# Patient Record
Sex: Female | Born: 1965 | Race: White | Hispanic: No | Marital: Married | Smoking: Never smoker
Health system: Southern US, Community
[De-identification: ages and names within clinical notes are randomized; demographics above are authoritative.]

## PROBLEM LIST (undated history)

## (undated) DIAGNOSIS — G4730 Sleep apnea, unspecified: Secondary | ICD-10-CM

## (undated) DIAGNOSIS — G4733 Obstructive sleep apnea (adult) (pediatric): Principal | ICD-10-CM

## (undated) DIAGNOSIS — Z22322 Carrier or suspected carrier of Methicillin resistant Staphylococcus aureus: Secondary | ICD-10-CM

## (undated) DIAGNOSIS — E875 Hyperkalemia: Secondary | ICD-10-CM

## (undated) DIAGNOSIS — T7840XA Allergy, unspecified, initial encounter: Secondary | ICD-10-CM

## (undated) DIAGNOSIS — R1013 Epigastric pain: Secondary | ICD-10-CM

## (undated) DIAGNOSIS — K649 Unspecified hemorrhoids: Secondary | ICD-10-CM

## (undated) DIAGNOSIS — F418 Other specified anxiety disorders: Secondary | ICD-10-CM

## (undated) DIAGNOSIS — E7800 Pure hypercholesterolemia, unspecified: Secondary | ICD-10-CM

## (undated) DIAGNOSIS — I10 Essential (primary) hypertension: Secondary | ICD-10-CM

## (undated) HISTORY — DX: Sleep apnea, unspecified: G47.30

## (undated) HISTORY — DX: Epigastric pain: R10.13

## (undated) HISTORY — DX: Other specified anxiety disorders: F41.8

## (undated) HISTORY — DX: Allergy, unspecified, initial encounter: T78.40XA

## (undated) HISTORY — DX: Hyperkalemia: E87.5

## (undated) HISTORY — DX: Obstructive sleep apnea (adult) (pediatric): G47.33

## (undated) HISTORY — DX: Unspecified hemorrhoids: K64.9

---

## 1997-08-17 ENCOUNTER — Inpatient Hospital Stay (HOSPITAL_COMMUNITY): Admission: AD | Admit: 1997-08-17 | Discharge: 1997-08-20 | Payer: Self-pay | Admitting: Obstetrics & Gynecology

## 1998-02-19 ENCOUNTER — Other Ambulatory Visit: Admission: RE | Admit: 1998-02-19 | Discharge: 1998-02-19 | Payer: Self-pay | Admitting: Family Medicine

## 1999-03-03 ENCOUNTER — Other Ambulatory Visit: Admission: RE | Admit: 1999-03-03 | Discharge: 1999-03-03 | Payer: Self-pay | Admitting: Obstetrics and Gynecology

## 1999-12-03 ENCOUNTER — Encounter: Admission: RE | Admit: 1999-12-03 | Discharge: 2000-03-02 | Payer: Self-pay | Admitting: Family Medicine

## 2000-06-12 ENCOUNTER — Other Ambulatory Visit: Admission: RE | Admit: 2000-06-12 | Discharge: 2000-06-12 | Payer: Self-pay | Admitting: Gynecology

## 2001-03-07 ENCOUNTER — Encounter (INDEPENDENT_AMBULATORY_CARE_PROVIDER_SITE_OTHER): Payer: Self-pay

## 2001-03-07 ENCOUNTER — Ambulatory Visit (HOSPITAL_COMMUNITY): Admission: RE | Admit: 2001-03-07 | Discharge: 2001-03-07 | Payer: Self-pay | Admitting: Obstetrics and Gynecology

## 2001-08-23 ENCOUNTER — Other Ambulatory Visit: Admission: RE | Admit: 2001-08-23 | Discharge: 2001-08-23 | Payer: Self-pay | Admitting: Obstetrics and Gynecology

## 2002-04-24 ENCOUNTER — Encounter: Payer: Self-pay | Admitting: Obstetrics and Gynecology

## 2002-04-24 ENCOUNTER — Ambulatory Visit (HOSPITAL_COMMUNITY): Admission: RE | Admit: 2002-04-24 | Discharge: 2002-04-24 | Payer: Self-pay | Admitting: Obstetrics and Gynecology

## 2002-11-14 ENCOUNTER — Inpatient Hospital Stay (HOSPITAL_COMMUNITY): Admission: AD | Admit: 2002-11-14 | Discharge: 2002-11-14 | Payer: Self-pay | Admitting: Pediatrics

## 2002-11-20 ENCOUNTER — Inpatient Hospital Stay (HOSPITAL_COMMUNITY): Admission: AD | Admit: 2002-11-20 | Discharge: 2002-11-22 | Payer: Self-pay | Admitting: Obstetrics and Gynecology

## 2002-12-20 ENCOUNTER — Other Ambulatory Visit: Admission: RE | Admit: 2002-12-20 | Discharge: 2002-12-20 | Payer: Self-pay | Admitting: Obstetrics and Gynecology

## 2003-01-05 ENCOUNTER — Emergency Department (HOSPITAL_COMMUNITY): Admission: EM | Admit: 2003-01-05 | Discharge: 2003-01-05 | Payer: Self-pay | Admitting: Emergency Medicine

## 2003-01-12 ENCOUNTER — Emergency Department (HOSPITAL_COMMUNITY): Admission: EM | Admit: 2003-01-12 | Discharge: 2003-01-12 | Payer: Self-pay | Admitting: Emergency Medicine

## 2003-05-31 HISTORY — PX: OTHER SURGICAL HISTORY: SHX169

## 2004-03-09 ENCOUNTER — Other Ambulatory Visit: Admission: RE | Admit: 2004-03-09 | Discharge: 2004-03-09 | Payer: Self-pay | Admitting: Obstetrics and Gynecology

## 2004-05-19 ENCOUNTER — Ambulatory Visit: Payer: Self-pay | Admitting: Internal Medicine

## 2004-05-19 ENCOUNTER — Emergency Department (HOSPITAL_COMMUNITY): Admission: EM | Admit: 2004-05-19 | Discharge: 2004-05-19 | Payer: Self-pay | Admitting: Emergency Medicine

## 2004-05-26 ENCOUNTER — Ambulatory Visit: Payer: Self-pay | Admitting: Internal Medicine

## 2004-06-18 ENCOUNTER — Ambulatory Visit: Payer: Self-pay | Admitting: Family Medicine

## 2004-06-21 ENCOUNTER — Ambulatory Visit: Payer: Self-pay | Admitting: Internal Medicine

## 2004-07-21 ENCOUNTER — Ambulatory Visit: Payer: Self-pay | Admitting: Internal Medicine

## 2004-08-16 ENCOUNTER — Ambulatory Visit: Payer: Self-pay | Admitting: *Deleted

## 2004-08-20 ENCOUNTER — Ambulatory Visit: Payer: Self-pay | Admitting: Internal Medicine

## 2004-08-20 ENCOUNTER — Observation Stay (HOSPITAL_COMMUNITY): Admission: RE | Admit: 2004-08-20 | Discharge: 2004-08-21 | Payer: Self-pay | Admitting: Internal Medicine

## 2004-09-27 ENCOUNTER — Ambulatory Visit: Payer: Self-pay | Admitting: Internal Medicine

## 2004-10-15 ENCOUNTER — Ambulatory Visit: Payer: Self-pay | Admitting: Family Medicine

## 2004-11-26 ENCOUNTER — Ambulatory Visit: Payer: Self-pay | Admitting: Family Medicine

## 2005-04-20 ENCOUNTER — Ambulatory Visit: Payer: Self-pay | Admitting: Family Medicine

## 2005-06-24 ENCOUNTER — Other Ambulatory Visit: Admission: RE | Admit: 2005-06-24 | Discharge: 2005-06-24 | Payer: Self-pay | Admitting: Obstetrics and Gynecology

## 2005-12-21 ENCOUNTER — Ambulatory Visit (HOSPITAL_COMMUNITY): Admission: RE | Admit: 2005-12-21 | Discharge: 2005-12-21 | Payer: Self-pay | Admitting: Cardiology

## 2005-12-21 ENCOUNTER — Encounter (INDEPENDENT_AMBULATORY_CARE_PROVIDER_SITE_OTHER): Payer: Self-pay | Admitting: Cardiology

## 2006-07-19 ENCOUNTER — Ambulatory Visit: Payer: Self-pay | Admitting: Infectious Diseases

## 2006-07-20 ENCOUNTER — Encounter: Payer: Self-pay | Admitting: Infectious Diseases

## 2006-07-20 DIAGNOSIS — Z22322 Carrier or suspected carrier of Methicillin resistant Staphylococcus aureus: Secondary | ICD-10-CM | POA: Insufficient documentation

## 2006-07-20 DIAGNOSIS — B009 Herpesviral infection, unspecified: Secondary | ICD-10-CM | POA: Insufficient documentation

## 2006-07-20 DIAGNOSIS — L0390 Cellulitis, unspecified: Secondary | ICD-10-CM

## 2006-07-20 DIAGNOSIS — I10 Essential (primary) hypertension: Secondary | ICD-10-CM | POA: Insufficient documentation

## 2006-07-20 DIAGNOSIS — L0291 Cutaneous abscess, unspecified: Secondary | ICD-10-CM | POA: Insufficient documentation

## 2006-08-29 ENCOUNTER — Telehealth: Payer: Self-pay | Admitting: Infectious Diseases

## 2006-08-30 ENCOUNTER — Ambulatory Visit: Payer: Self-pay | Admitting: Infectious Diseases

## 2006-08-30 DIAGNOSIS — M25519 Pain in unspecified shoulder: Secondary | ICD-10-CM | POA: Insufficient documentation

## 2006-10-17 ENCOUNTER — Ambulatory Visit: Payer: Self-pay | Admitting: Infectious Diseases

## 2006-10-18 ENCOUNTER — Encounter: Payer: Self-pay | Admitting: Infectious Diseases

## 2006-11-08 ENCOUNTER — Telehealth: Payer: Self-pay | Admitting: Infectious Diseases

## 2006-12-06 ENCOUNTER — Ambulatory Visit: Payer: Self-pay | Admitting: Infectious Diseases

## 2006-12-06 DIAGNOSIS — R21 Rash and other nonspecific skin eruption: Secondary | ICD-10-CM | POA: Insufficient documentation

## 2007-02-05 DIAGNOSIS — F329 Major depressive disorder, single episode, unspecified: Secondary | ICD-10-CM | POA: Insufficient documentation

## 2007-02-05 DIAGNOSIS — F3289 Other specified depressive episodes: Secondary | ICD-10-CM | POA: Insufficient documentation

## 2007-02-07 ENCOUNTER — Ambulatory Visit: Payer: Self-pay | Admitting: Infectious Diseases

## 2007-06-11 ENCOUNTER — Telehealth: Payer: Self-pay | Admitting: Infectious Diseases

## 2008-05-26 ENCOUNTER — Encounter: Payer: Self-pay | Admitting: Infectious Diseases

## 2008-06-30 ENCOUNTER — Encounter: Admission: RE | Admit: 2008-06-30 | Discharge: 2008-06-30 | Payer: Self-pay | Admitting: Internal Medicine

## 2009-12-23 IMAGING — CR DG SACRUM/COCCYX 2+V
4 series · 4 of 4 positions shown · non-contrast
Comparison: None

CLINICAL DATA: Coccygeal pain.

SACRUM AND COCCYX - 2+ VIEW

[t sacrum a.p.]
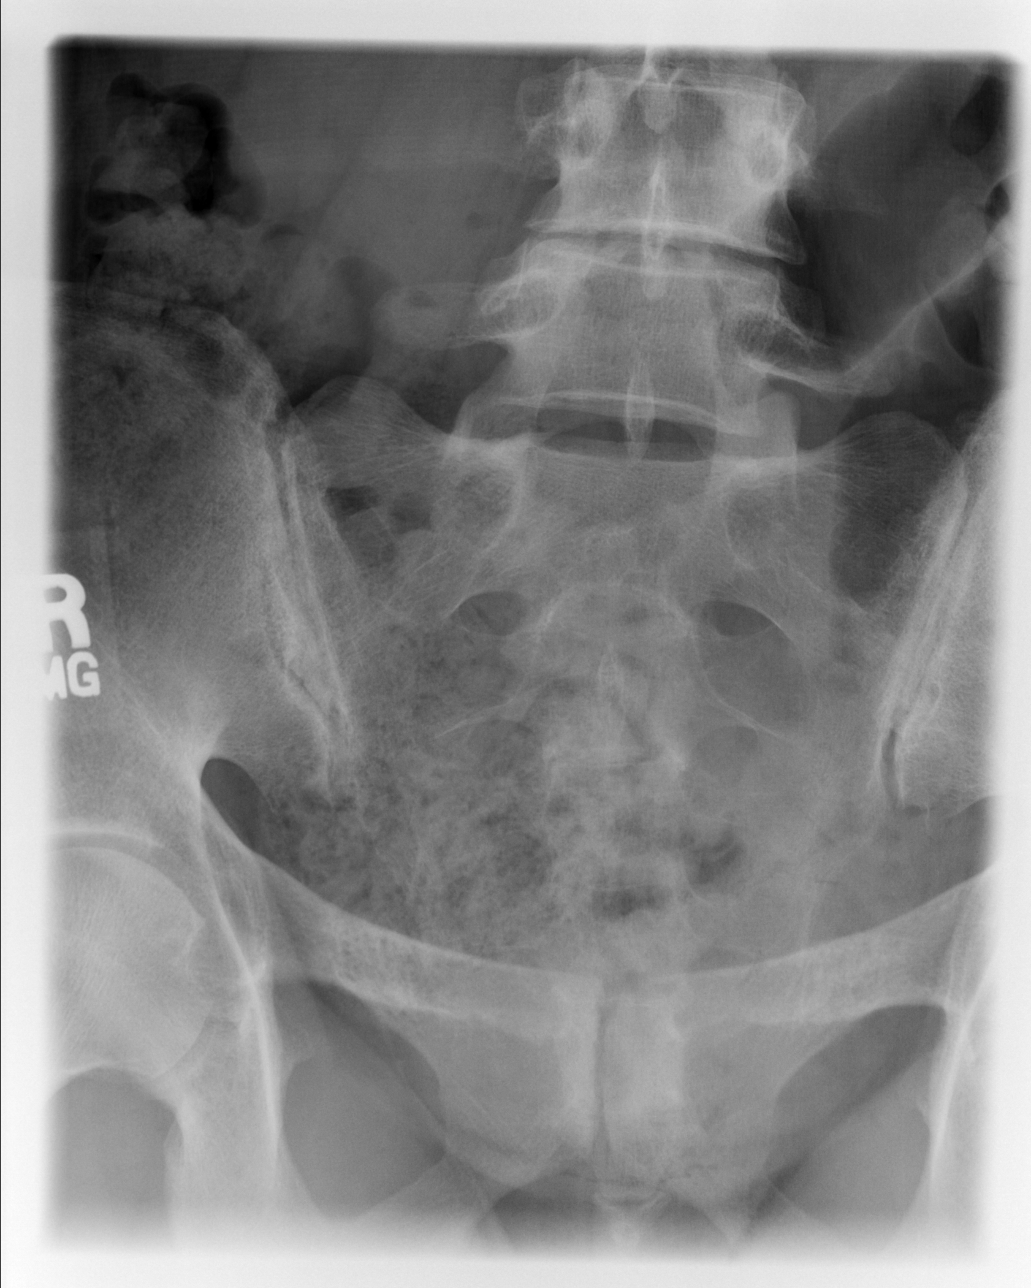

[t coccyx a.p.]
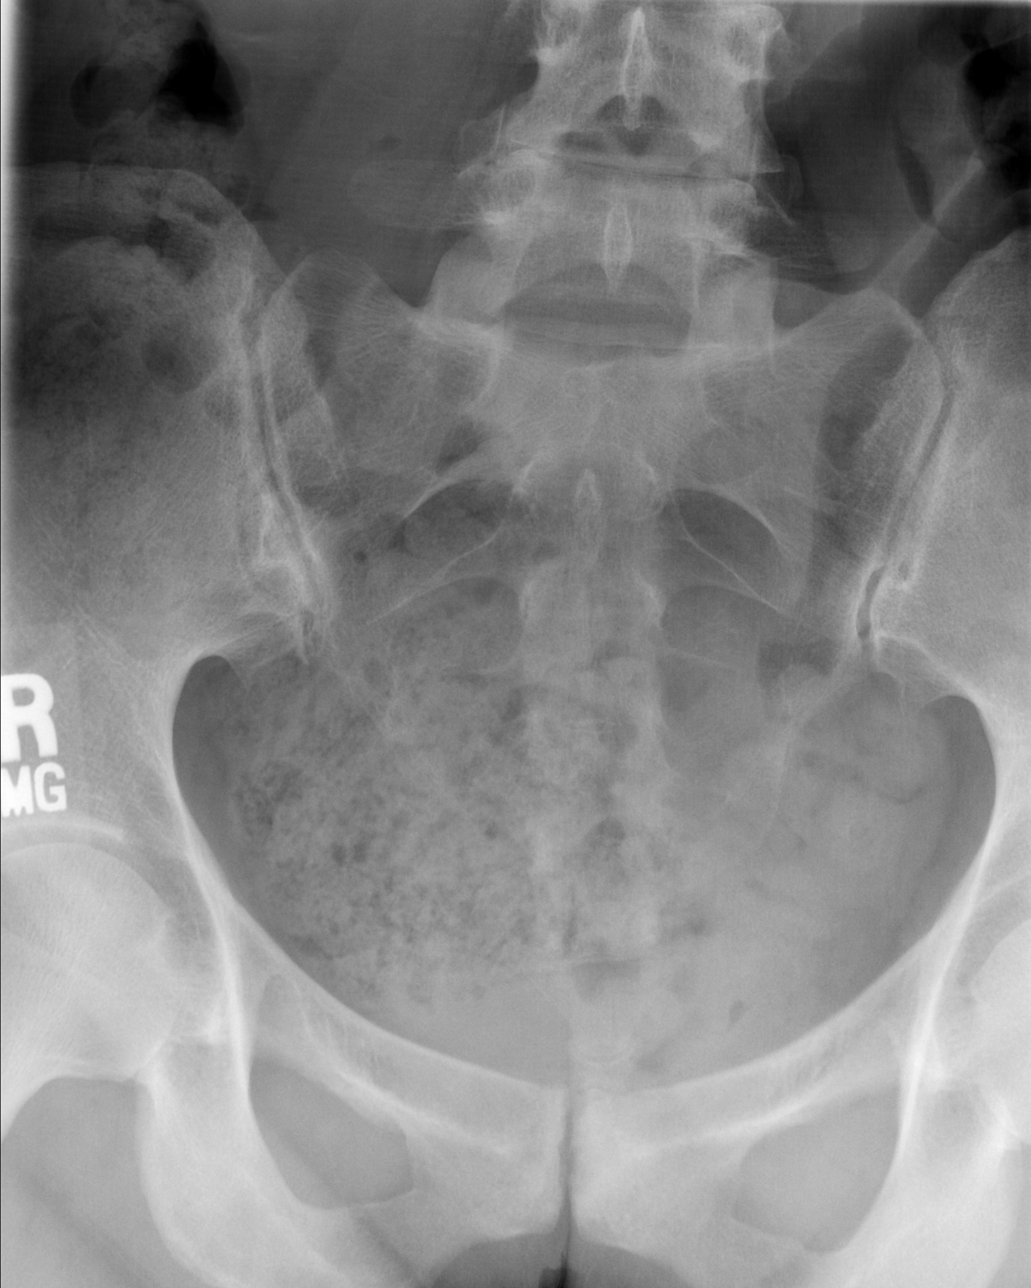

[t coccyx lat (1 of 2)]
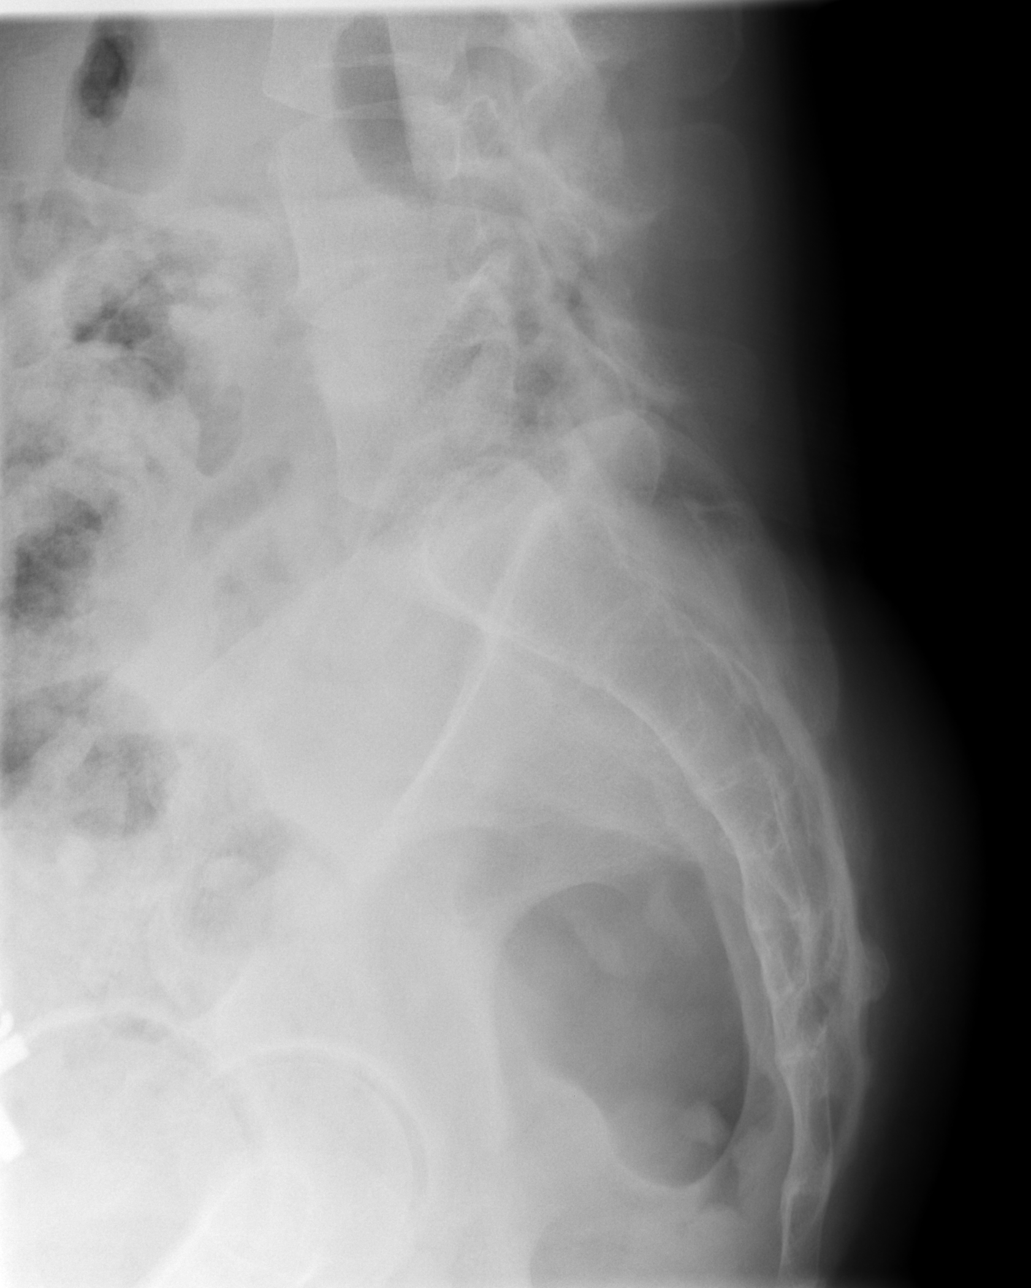

[t coccyx lat (2 of 2)]
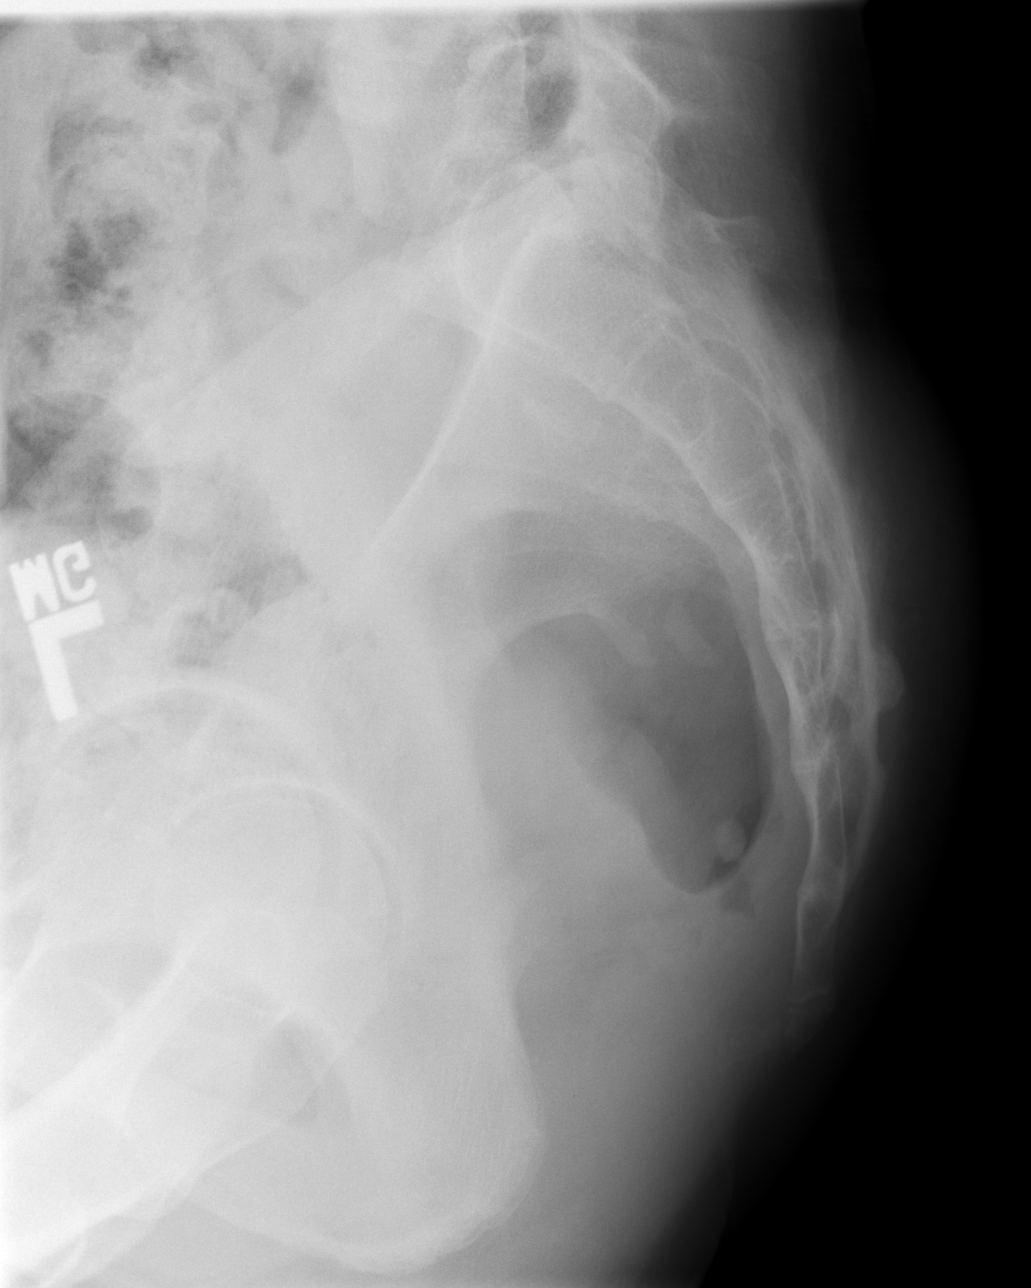

[4 of 4 positions shown; findings below may reference images not displayed]

FINDINGS: No sign of sacral fracture.  There is acute angulation at
the tip of the coccyx.  This can be normal or can reflect a
coccygeal post-traumatic malalignment.
IMPRESSION: No sacral fracture.  Angulation of the distal coccyx that can be
baseline or be post-traumatic.

## 2009-12-23 IMAGING — CR DG LUMBAR SPINE COMPLETE 4+V
5 series · 5 of 5 positions shown · non-contrast
Comparison: None

CLINICAL DATA: Sledding accident.  Coccygeal pain.

LUMBAR SPINE - COMPLETE 4+ VIEW

[t l-spine a.p.]
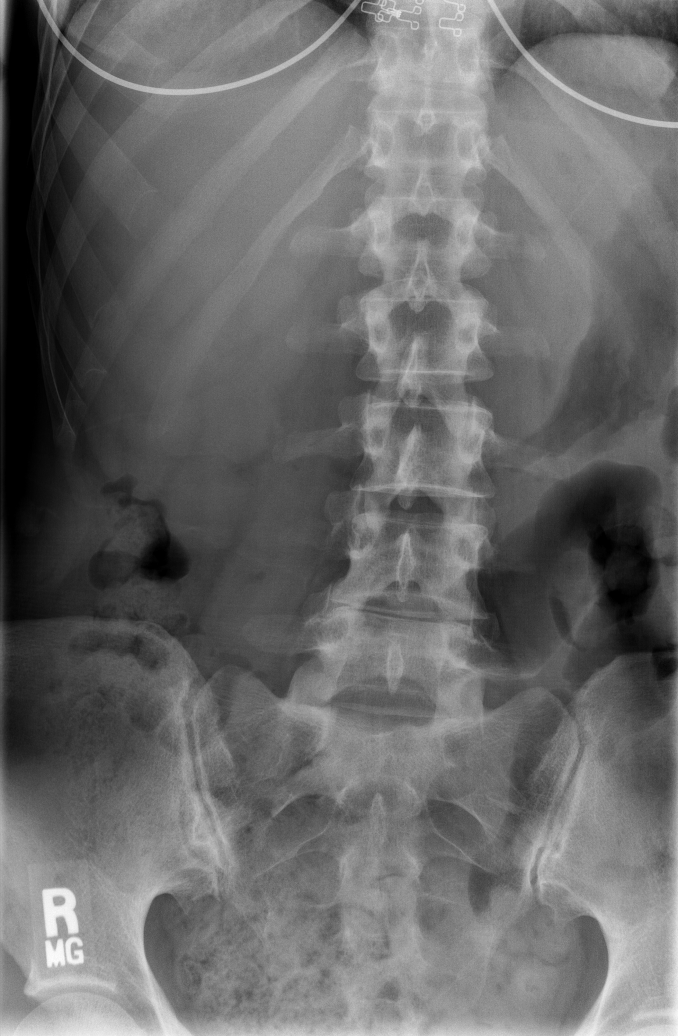

[t l-spine oblique exposure (1 of 2)]
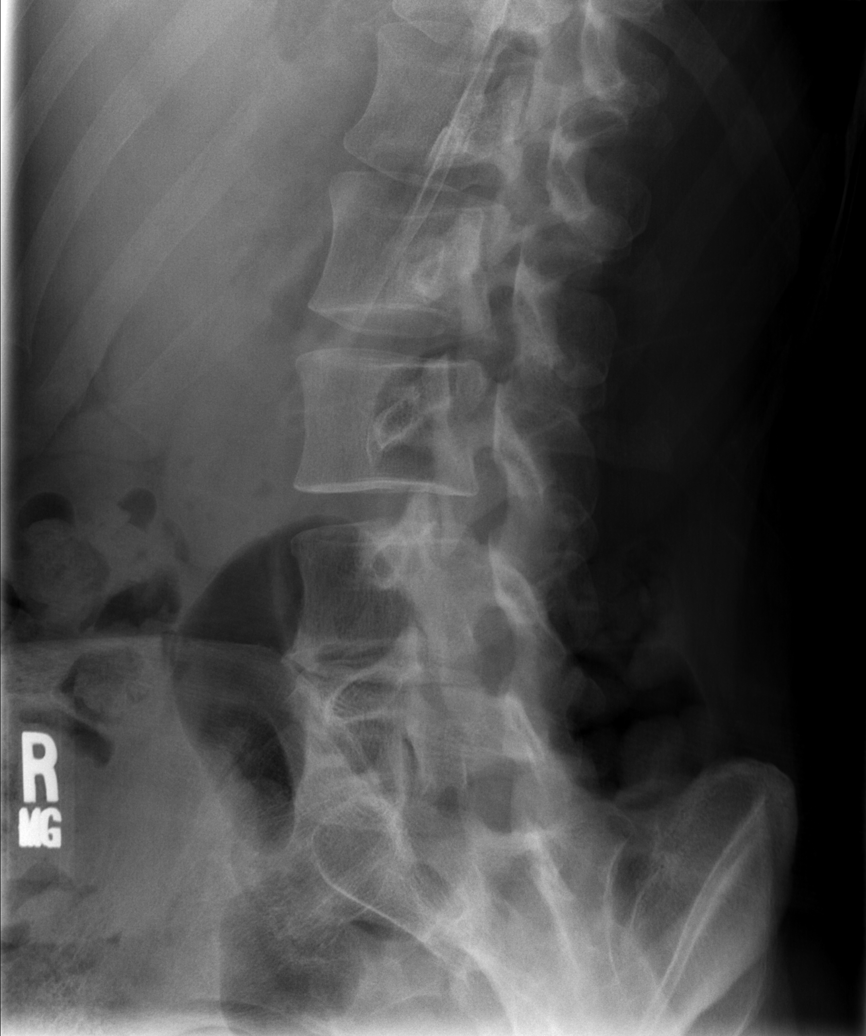

[t l-spine oblique exposure (2 of 2)]
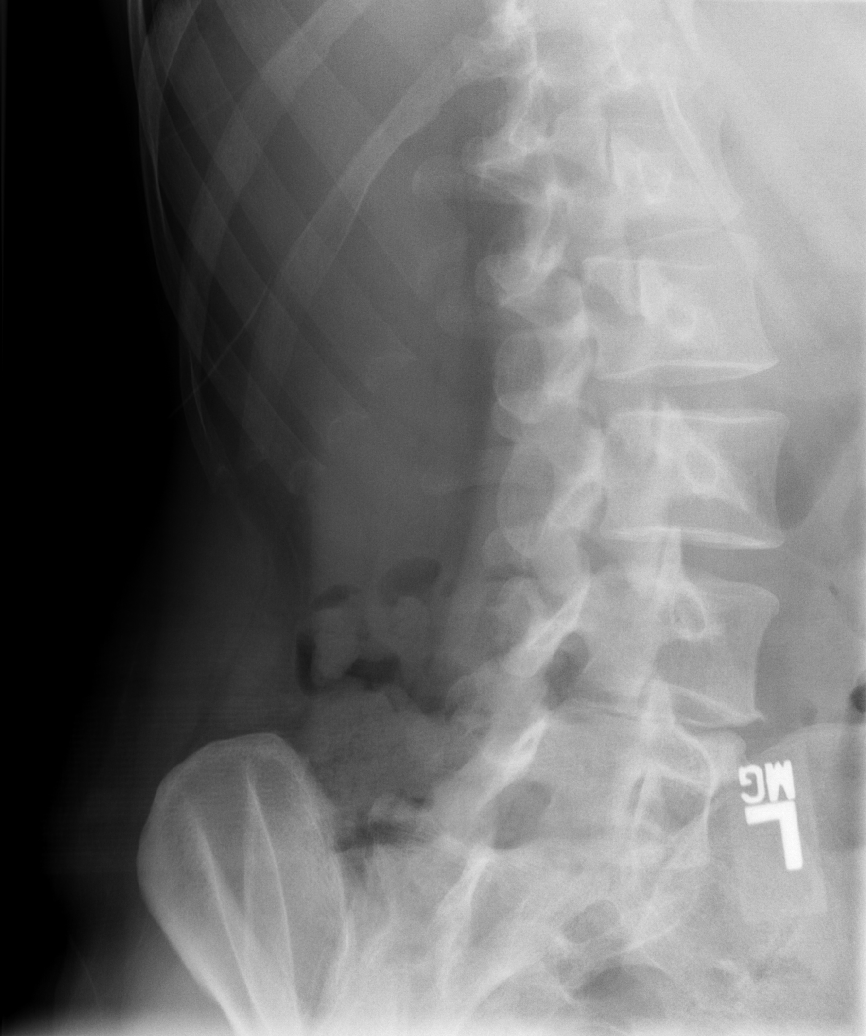

[t l-spine lat]
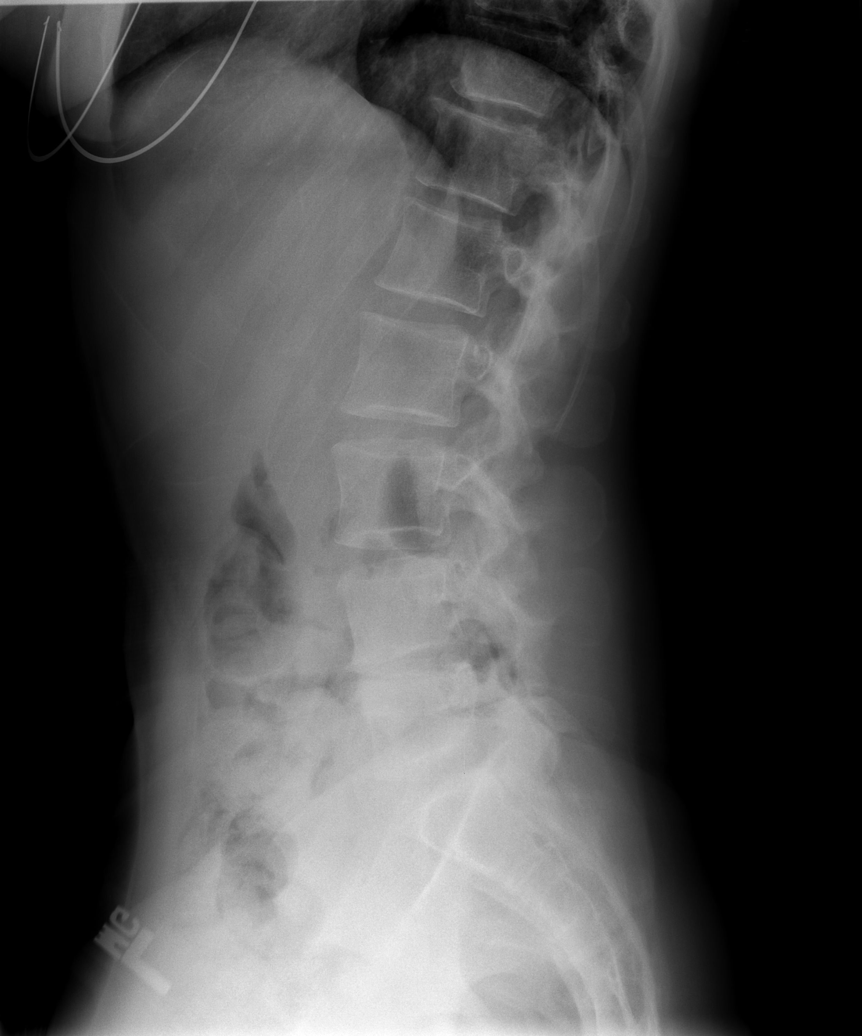

[t l-spine l5-s1 spot]
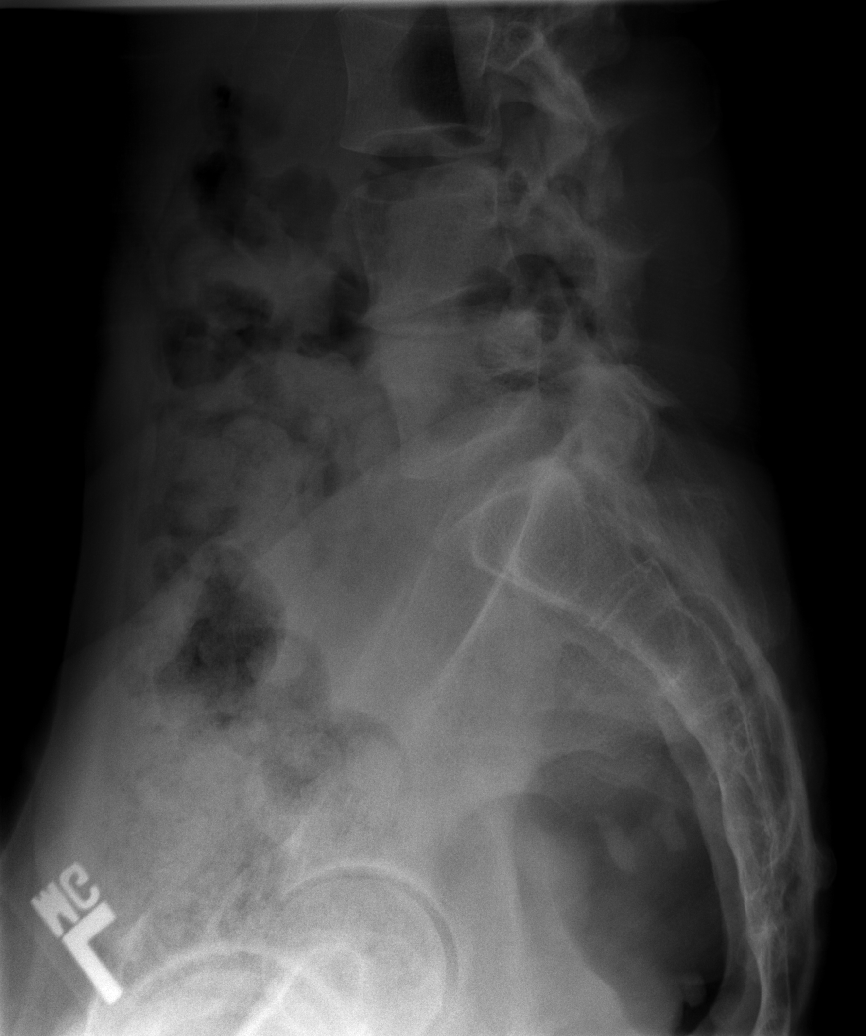

[5 of 5 positions shown; findings below may reference images not displayed]

FINDINGS: There is mild scoliosis convex to the left.  There is
disc space narrowing at L4-5.  No subluxation in the lateral
projection.  No sign of fracture.
IMPRESSION: This space narrowing at L4-5, presumably chronic.  No acute
finding.

## 2010-06-20 ENCOUNTER — Encounter: Payer: Self-pay | Admitting: Internal Medicine

## 2010-10-15 NOTE — Op Note (Signed)
Kayla Ortiz, Kayla Ortiz           ACCOUNT NO.:  [id number]   MEDICAL RECORD NO.:  [id number]          PATIENT TYPE:  INP   LOCATION:  6525                         FACILITY:  [facility]   PHYSICIAN:  Doylene Canning. [name], M.D.  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   ATTENDING:  Doylene Canning. [name], M.D.   I INTRODUCTION:  The patient is a 45 year old woman with a history of  recurrent tachy-palpitations and documented SVT at rates between 113  and  150 beats per minute.  These tend to start and stop suddenly.  They are not  related to exertion.  She has been on multiple medications.  She is now  referred for electrophysiology study and catheter ablation.   II. PROCEDURE:  After informed consent was obtained, the patient taken to  the diagnostic EP lab in fasting state.  After the usual preparation and  draping, intravenous fentanyl and midazolam were given for sedation.  A 6-  [place] hexapolar catheter was inserted percutaneously into the right jugular  vein and advanced to the coronary sinus.  A 5-French quadripolar catheter  was inserted percutaneously in the right femoral vein and advanced the His  bundle region.  A 5-French quadripolar catheter was inserted percutaneously  in the right femoral vein and advanced to the right ventricle.  After  measurement of the basic intervals, rapid ventricular pacing was carried out  the RV apex at pacing cycle length of 600 msec and stepwise decreased down  to 310 msec, where V-A Wenckebach was observed.  During rapid ventricular  pacing, the atrial activation sequence was midline and decremental.  Next,  programmed ventricular stimulation was carried out from the RV apex and at  basic drive cycle length of 478 msec. The S1-S2  interval was stepwise  decreased from 440 msec to 210 msec, where ventricular refractoriness was  observed.  During programmed ventricular stimulation, the atrial activation  sequence was midline and decremental.  Next, programmed atrial stimulation  was carried out from the coronary sinus at a basic drive cycle length of 295  msec.  The S1-S2 interval was stepwise decreased from 440 msec down to 330  msec, where A-V node ERP was observed.  During probing stimulation, there  were A-H jumps and echo beats noted, but no initial SVT.  Next, rapid atrial  pacing was carried out the coronary sinus and pacing cycle length of 490  msec and stepwise decreased down to 340 msec, where A-V Wenckebach was  observed.  During rapid atrial pacing, the P-R interval was equal to the R-R  interval, but there was initially no SVT induced.  Isoproterenol at 2 mcg  per minute was then infused.  Additional rapid atrial pacing was carried out  from the coronary sinus as well as from the high heart atrium at pacing  cycle lengths of 490 msec and stepwise-decreased down to 270 msec.  During  rapid atrial pacing at  270 msec, there was inducible SVT at a cycle length  of 440 msec.  Mapping of this tachycardia demonstrated midline atrial  activation and a very short V-A time with the V and A essentially  superimposed on the His bundle catheter.  In addition to that, the proximal  CS was activated earlier than the distal CS.  This tachycardia was  reproducibly induced and was terminated with rapid atrial pacing at a cycle  length of typically 330-350 msec.  In addition, during SVT, PVCs placed at  the time of His bundle refractoriness did not pre-excite the atrium.  Ventricular pacing in SVT during tachycardia demonstrated VA-AV conduction  sequence.  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  The ablation catheter was maneuvered into Koch's triangle.  Mapping was carried out in  Koch's triangle, demonstrating a very small  Koch's triangle.  Three RF energy applications were delivered at site 6 in  Koch's triangle, which resulted in prolonged accelerated junctional rhythm.  Following ablation, the accelerated junctional rhythm would immediately  terminate and sinus rhythm would be restored with normal A-V conduction.  Following RF energy application, isoproterenol was reinitiated at rates of 1-  2 mcg per minute and rapid atrial pacing was again carried out.  This time,  there was no inducible SVT.  The patient was observed for 30 minutes.  During this time, rapid atrial pacing was carried out from the coronary  sinus as well as from the high right atrium, as well as programmed atrial  stimulation from the coronary sinus and the high right atrium.  Despite  multiple attempts to reduce the tachycardia, it was not reinducible.  The  catheters were removed, hemostasis was assured and the patient was returned  to her room in satisfactory condition.   III. COMPLICATIONS:  There were no immediate procedural complications.   IV. RESULTS:  A.  Baseline ECG:  The baseline ECG demonstrates sinus rhythm  with normal axis and intervals.  B.  Baseline intervals:  The sinus node cycle length was 645 msec, the H-V  interval was 50 msec and the QRS duration was 84 msec.  C.  Rapid ventricular pacing:  Rapid ventricular pacing was carried out the  RV apex at pacing cycle length of 500 msec and stepwise decreased down to  310 msec, where a V-A Wenckebach was observed.  During rapid ventricular  pacing, the atrial activation was midline and decremental.  D.  Programmed ventricular stimulation:  Programmed atrial stimulation was  carried out the RV apex at a basic drive cycle length of 132 msec. The S1-S2  interval was stepwise decreased down to 210 msec, where ventricular  refractoriness was observed.  During programmed ventricular stimulation, the atrial activation sequence was  midline and decremental.  E.  Programmed atrial stimulation:  Programmed atrial stimulation was  carried out from the coronary sinus as well as from the high right atrium at  the basic drive cycle lengths of 440 msec.  The S1-S2 interval was stepwise  decreased down to the atrial refractoriness.  During probing stimulation,  there were multiple A-H jumps and echo beats, but no inducible SVT.  F. Rapid atrial pacing:  Rapid atrial pacing  was carried out from the  coronary sinus as well as the heart atrium at pacing cycle length of 600  msec and stepwise decreased down to 340 msec, where A-V Wenckebach was  observed.  During rapid atrial pacing the P-R interval was greater  than the  R-R interval.  On isoproterenol, rapid atrial pacing resulted in inducible  SVT.  G.  Arrhythmias observed:  1.  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  On isoproterenol, duration was sustained.  Termination was with      rapid atrial pacing.      1.  Mapping:  Mapping of Koch's triangle demonstrated a very, very small          Koch's triangle.          1.  RF energy application:  A total of 3 RF energy applications were              delivered to site 6 in Koch's triangle, resulting in accelerated              junctional rhythm.  Following ablation, there was no inducible              SVT.   V. CONCLUSION:  The study demonstrates successful electrophysiologic study  and radiofrequency catheter ablation of typical atrioventricular node  reentry tachycardia with a total of 3 radiofrequency energy applications at  the Koch's triangle, resulting in rendering of the tachycardia not  inducible.      GWT/MEDQ  D:  08/20/2004  T:  08/21/2004  Job:  [id number]   cc:   Gordy Savers, M.D. [facility]

## 2010-10-15 NOTE — Discharge Summary (Signed)
Kayla Ortiz, Kayla Ortiz           ACCOUNT NO.:  [id number]   MEDICAL RECORD NO.:  [id number]          PATIENT TYPE:  INP   LOCATION:  6525                         FACILITY:  [facility]   PHYSICIAN:  Doylene Canning. [name], M.D.  DATE OF BIRTH:  June 04, 1965   DATE OF ADMISSION:  08/20/2004  DATE OF DISCHARGE:  08/21/2004                           DISCHARGE SUMMARY - REFERRING   HISTORY OF PRESENT ILLNESS:  [name] is a 45 year old, white female who  is followed by Dr. [name] for recurrent tachycardic palpitations.  These  initially began back in November or December 2005.  She was noted to have a  narrow QRS tachycardia at rates between 130-170 with a short RP tachycardia  and Dr. [name] noted that P-wave morphology was somewhat difficult to  ascertain.  Despite medications with reoccurring problems with tachycardic  palpitations, the patient elected to undergo ablation.   LABORATORY DATA AND X-RAY FINDINGS:  Preadmission H&H is 15.1 and 45.5,  normal indices, platelets 234, WBC 6.6.  PTT 30.1, PT 12.8, INR 1.  Sodium  139, potassium 4.0, BUN 12, creatinine 0.9.  HCG was negative.   Last chest x-ray on December 21, did not show any active evidence of acute  disease.  EKG postprocedure showed normal sinus rhythm, normal axis, no  change.   HOSPITAL COURSE:  Dr. [name] performed ablation on August 20, 2004, without  difficulty.  Please refer to dictated note.  On the morning of March 25, she  stated she was doing well.  She was complaining of a headache.  Catheterization sites were intact.  Dr. Dorethea Clan reviewed and felt that the  patient could be discharged home.   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   DISPOSITION:  She is discharged home.   DISCHARGE MEDICATIONS:  1.  Effexor 150 mg b.i.d.  2.  Wellbutrin 150 t.i.d.  3.  Propranolol 10 p.r.n.  4.  Enteric coated aspirin 325 q.d. for the  next 6 months.  5.  Four Tylenol No. 3 one q.6h. for her headaches without refills.   SPECIAL INSTRUCTIONS:  She was also instructed over the next 6 months until  September 2006, if she had any dental work including teeth cleaning, to call  [facility] for antibiotic coverage.   ACTIVITY:  She was instructed to avoid driving for the next 2 days.  Straining or heavy lifting for 2 weeks.   WOUND CARE:  She may shower.  If she had any problems with the  catheterization site, she was instructed to call.   FOLLOW UP:  Dr. Lubertha Basque office will call her with an appointment in  approximately 4 weeks.      EW/MEDQ  D:  08/21/2004  T:  08/21/2004  Job:  [id number]   cc:   Vida Roller, M.D.  Fax: [phone]   Tera Mater. Clent Ridges, M.D. [facility]

## 2010-10-15 NOTE — Discharge Summary (Signed)
Kayla Ortiz, Kayla Ortiz           ACCOUNT NO.:  [id number]   MEDICAL RECORD NO.:  [id number]          PATIENT TYPE:  INP   LOCATION:  6525                         FACILITY:  [facility]   PHYSICIAN:  Doylene Canning. [name], M.D.  DATE OF BIRTH:  Nov 01, 1965   DATE OF ADMISSION:  08/20/2004  DATE OF DISCHARGE:  08/21/2004                                 DISCHARGE SUMMARY   DISCHARGE DIAGNOSES:  1.  History of symptomatic and recurrent narrow QRS tachyarrhythmias found      on electrophysiology study to be A-V nodal reentrant tachycardia.  2.  Status post successful radiofrequency catheter ablation of AVNRT by Dr.      Lewayne Bunting.   SECONDARY DIAGNOSES:  1.  History for the last five months of tachyarrhythmias with symptomatic      palpitations and shortness of breath.  Symptoms are shortness of breath      but no chest pain or syncope.  2.  Adult attention deficit disorder on Adderall, which was stopped in the      face of these tachy palpitations.   PROCEDURE:  August 20, 2004:  Electrophysiology study with radiofrequency  catheter ablation of AVNRT, Dr. Lewayne Bunting.  Patient is maintaining sinus  rhythm post procedure.   Discharging post procedure day #1.   Patient discharged on the following medications:  1.  Effexor 150 mg twice daily.  2.  Wellbutrin 150 mg three tabs daily.  3.  Propranolol 10 mg to be taken as needed for palpitation.  4.  Adderall, which the patient has been taking prior to her tachy      palpitations, will be added back at her discretion.   She is asked to start enteric coated aspirin 325 mg daily for the next 6  weeks and for the next 6 months until September, 2006.  She is to call  [facility] at [phone] if she plans dental work, even teeth  cleaning; [facility] will provide antibiotic coverage.  For pain at  the catheterization site, Tylenol 325 mg 1-2 tabs every 4-6 hours as needed.  She is to avoid driving in the next 2 days, avoid straining  or heavy lifting  for the next 2 weeks, she may shower, and she is to call [facility]  if she experiences pain or swelling at the catheterization sites.  She is to  see Dr. [name] in the office in 4 weeks, is office will call with that  appointment.   BRIEF HISTORY:  The patient is a 45 year old female.  She has a history of  recurrent tachy palpitations which initially started November or December,  2005.  She came to the emergency room and at that time was found to have a  narrow QRS tachycardia with high rates up to 170s.  Apparently the strip  showed a short RP tachycardia, although P-wave morphology is somewhat  difficult to distinguish.  The patient had initially been taken off her  Adderall but felt fatigued and sluggish when not on it.  When this was  restarted the patient did develop recurrent severe tachy palpitations.  She  was seen initially on May 26, 2004, and at that time electrophysiology  study was mentioned although the first option would be to continue beta-  blocker therapy.  Because the patient wishes to restart taking Adderall, the  patient wishes to proceed with catheter ablation.  This will be planned  electively for August 20, 2004.   HOSPITAL COURSE:  The patient presented August 20, 2004, for  electrophysiology study and, if possible, radiofrequency catheter ablation.  The study showed A-V nodal reentrant tachycardia, this was successfully  ablated by Dr. Lewayne Bunting.  The patient has had no complications post  ablation and goes home with the medications and followup as dictated.      GM/MEDQ  D:  08/20/2004  T:  08/22/2004  Job:  [id number]   cc:   Gordy Savers, M.D. [facility]   Doylene Canning. [name], M.D.

## 2010-10-15 NOTE — Op Note (Signed)
[facility]  Patient:    Kayla Ortiz, Kayla Ortiz Visit Number: [id number] MRN: [id number]          Service Type: Attending:  Janeece Riggers. Dareen Piano, M.D. Dictated by:   Janeece Riggers Dareen Piano, M.D. Proc. Date: 03/07/01                             Operative Report  PREOPERATIVE DIAGNOSIS:       Spontaneous abortion.  POSTOPERATIVE DIAGNOSIS:      Spontaneous abortion.  PROCEDURE:                    Dilation and curettage.  SURGEON:                      Mark E. Dareen Piano, M.D.  ANESTHESIA:                   MAC with paracervical block.  ANTIBIOTICS:                  Ancef 1 g.  DRAINS:                       None.  ESTIMATED BLOOD LOSS:         Minimal.  COMPLICATIONS:                None.  SPECIMEN:                     Products of conception sent to pathology.  INDICATIONS:                  Ms. Klutts is a 45 year old white female who is approximately 7-1/2 weeks estimated gestational age who began to have spotting. The patient had an ultrasound which revealed gestational sac with no fetal heart tones. Sac size was consistent with 6-1/2 weeks. The patient had a quantitative beta hCG which went from 37,000 to 39,000. Given these findings, the patient was diagnosed with a spontaneous AB and set up for a dilation and curettage.  DESCRIPTION OF PROCEDURE:     The patient was taken to the operating room where she was placed in a dorsal supine position and MAC anesthesia was administered. She was placed in a dorsal lithotomy position, prepped with Hibiclens, and she was draped with green towels. A sterile speculum was placed in the vagina. Lidocaine 1%, 20 cc, was used for paracervical block. The anterior cervix was then grasped with a single tooth tenaculum and the cervical os was dilated to a 29 [place]. A 7 mm suction cannula was placed in the uterine cavity and products of conception withdrawn. Sharp curettage was then performed followed by a repeat  suction. The patient tolerated the procedure well.  DISPOSITION:                  The patient was discharged to home. She was sent home with Keflex 500 mg q.i.d. for two days and Tylox to take p.r.n. She will follow up in the office in four weeks. Dictated by:   Janeece Riggers Dareen Piano, M.D. Attending:  Janeece Riggers Dareen Piano, M.D. DD:  03/07/01 TD:  03/07/01 Job: [id number]

## 2010-10-15 NOTE — H&P (Signed)
NAMEGENESIA, CASLIN NO.:  [id number]   MEDICAL RECORD NO.:  [id number]          PATIENT TYPE:  EMS   LOCATION:  MAJO                         FACILITY:  [facility]   PHYSICIAN:  Rollene Rotunda, M.D.   DATE OF BIRTH:  [birth year]   DATE OF ADMISSION:  05/19/2004  DATE OF DISCHARGE:                                HISTORY & PHYSICAL   HISTORY OF PRESENT ILLNESS:  Ms. 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   The patient reports history of occasional palpitations, occurring  approximately once every six months, but developed unusually hard thumping  and palpitations approximately 12 noon today while she was still at work.  She had initial electrocardiogram there revealing supraventricular  tachycardia at 146 beats per minute with associated blood pressure of  170/120.  She felt that this was related to Adderall but went directly to  her primary care physician's office for evaluation of her blood pressure.  Repeat electrocardiogram showed persistent SVT at 147, and she was directed  to the emergency room.   On presentation, the patient denied any symptoms and, in fact, denied any  associated chest pain, diaphoresis, nausea, or dyspnea when the episode  began earlier that day.  Repeat electrocardiogram revealed persistent SVT in  the 140 beats per minute range.   ALLERGIES:  No known drug allergies.   MEDICATIONS:  1.  Adderall 50 mg daily.  2.  Effexor 150 mg b.i.d.  3.  Wellbutrin 450 mg daily.   PAST MEDICAL HISTORY:  Adult ADD.   SOCIAL HISTORY:  The patient lives in [place] with her husband.  She is  employed by [employer], working the Psychologist, sport and exercise.  She has two  children.  She denies any history of tobacco smoking, alcohol use, or  illicit drug use.   FAMILY  HISTORY:  Mother, age 48, hypertension.  Father, age 50, history of  stroke and DVTs.  A twin sister, age 22, hypertension.   REVIEW OF SYSTEMS:  Denies any history of exertional chest discomfort, has  had exertional dyspnea over the past six months, but denies orthopnea, PND,  or pedal edema.  Occasional palpitations (as described above).  Denies  history of thyroid disease.  No recent fever, chills, or productive cough.  Denies recent evidence of upper for lower GI bleeding.  Remaining systems  negative.   PHYSICAL EXAMINATION:  VITAL SIGNS:  Blood pressure 169/105 with a pulse of  146 on admission.  Temperature 98.7.  GENERAL:  An 45 year old female in no apparent distress.  HEENT:  Normocephalic and atraumatic.  NECK:  Preserved carotid pulses without bruits.  HEART:  Regular rhythm with increased rate (S1 and S2). No significant  murmurs, no rub.  LUNGS: Clear to auscultation in all fields.  ABDOMEN:  Soft, nontender.  Bowel sounds.  EXTREMITIES:  Preserved bilateral femoral pulses and intact distal pulses;  no edema noted.  NEUROLOGIC:  No focal deficits.   LABORATORY DATA  AND OTHER STUDIES:  Chest x-ray:  Pending.   Electrocardiogram:  Supraventricular tachycardia  at 143 beats per minute  with left axis deviation, nonspecific ST-T changes.   Laboratory data:  WBC 5.9, hemoglobin 15, hematocrit 44, platelets 245.  INR  0.9.  Sodium 136, potassium 3.6, glucose 91, BUN 7, creatinine 1.0.  Liver  enzymes normal.  TSH pending.   IMPRESSION:  1.  Supraventricular tachycardia..  2.  Adult attention deficit disorder.  3.  Hypertension.   PLAN:  The patient was successfully treated with successive doses of  adenosine at 6 mg, 12 mg, with subsequent conversion to normal sinus rhythm.  She remained asymptomatic during the episode.  She is cleared to be  discharged from the emergency room and will be started on Toprol XL 25 mg  daily.  We will have her follow up with use in the  office in the next few  weeks for continued monitoring.  Dr. Antoine Poche did suggest considering  discontinuation of Adderall and will review with this with Dr. Graciela Husbands, in  context of her SVT.  We also discussed the option of radio frequency  ablation in the event the tachyarrhythmia becomes recurrent.       GS/MEDQ  D:  05/19/2004  T:  05/19/2004  Job:  [id number]   cc:   Jeannett Senior A. Clent Ridges, M.D. [facility]

## 2011-05-14 ENCOUNTER — Emergency Department (HOSPITAL_COMMUNITY): Payer: BC Managed Care – PPO

## 2011-05-14 ENCOUNTER — Emergency Department (HOSPITAL_COMMUNITY)
Admission: EM | Admit: 2011-05-14 | Discharge: 2011-05-14 | Disposition: A | Discharge status: Home / Self Care | Payer: BC Managed Care – PPO | Attending: Emergency Medicine | Admitting: Emergency Medicine

## 2011-05-14 DIAGNOSIS — I10 Essential (primary) hypertension: Secondary | ICD-10-CM | POA: Insufficient documentation

## 2011-05-14 DIAGNOSIS — M533 Sacrococcygeal disorders, not elsewhere classified: Secondary | ICD-10-CM | POA: Insufficient documentation

## 2011-05-14 DIAGNOSIS — E7800 Pure hypercholesterolemia, unspecified: Secondary | ICD-10-CM | POA: Insufficient documentation

## 2011-05-14 DIAGNOSIS — Z79899 Other long term (current) drug therapy: Secondary | ICD-10-CM | POA: Insufficient documentation

## 2011-05-14 DIAGNOSIS — W19XXXA Unspecified fall, initial encounter: Secondary | ICD-10-CM

## 2011-05-14 DIAGNOSIS — W108XXA Fall (on) (from) other stairs and steps, initial encounter: Secondary | ICD-10-CM | POA: Insufficient documentation

## 2011-05-14 DIAGNOSIS — R42 Dizziness and giddiness: Secondary | ICD-10-CM

## 2011-05-14 HISTORY — DX: Essential (primary) hypertension: I10

## 2011-05-14 HISTORY — DX: Pure hypercholesterolemia, unspecified: E78.00

## 2011-05-14 HISTORY — DX: Carrier or suspected carrier of methicillin resistant Staphylococcus aureus: Z22.322

## 2011-05-14 LAB — POCT I-STAT, CHEM 8
Calcium, Ion: 1.12 mmol/L (ref 1.12–1.32)
Chloride: 102 mEq/L (ref 96–112)
Creatinine, Ser: 1 mg/dL (ref 0.50–1.10)
Glucose, Bld: 103 mg/dL — ABNORMAL HIGH (ref 70–99)
HCT: 42 % (ref 36.0–46.0)
Potassium: 3.7 mEq/L (ref 3.5–5.1)

## 2011-05-14 NOTE — ED Notes (Signed)
Patients that she slipped last night on stairs and fell.  Pt denies hitting her head.  Pt states that she had severe pain to her tail bone after fall.  Pt states that she had today she has had increase in dizziness with rapid change of position.  Pt has no n/v.  Pt denies any neck or back pain.  PERRL.  NO neuro deficits.  Pt states that last night after all she was very cold and cold not get warm.  Pt denies any dizziness at this time.  A&Ox4.  Pt does have bruising noted to her right FA with swelling to same.  Pt has full ROM to her FA.  Pt is ambulatory without difficulty.

## 2011-05-14 NOTE — ED Notes (Signed)
Patient transported to X-ray 

## 2011-05-14 NOTE — ED Notes (Signed)
Pt. Fell last night down 6 steps, witnessed by husband. Had LOC for seconds. No visble marks on her head. Pt.s husband saw pt. Shake for a few seconds after the fall.  AT present time, pt. Feels very dizzy and nauseated

## 2011-05-15 NOTE — ED Provider Notes (Addendum)
History     CSN: [id number] Arrival date & time: 05/14/2011 11:31 AM   First MD Initiated Contact with Patient 05/14/11 1157      Chief Complaint  Patient presents with  . Fall     HPI Patients that she slipped last night on stairs and fell. Pt denies hitting her head. Pt states that she had severe pain to her tail bone after fall. Pt states that she had today she has had increase in dizziness with rapid change of position. Pt has no n/v. Pt denies any neck or back pain. PERRL. NO neuro deficits. Pt states that last night after all she was very cold and cold not get warm. Pt denies any dizziness at this time. A&Ox4. Pt does have bruising noted to her right FA with swelling to same. Pt has full ROM to her FA. Pt is ambulatory without difficulty   Past Medical History  Diagnosis Date  . Hypertension   . Hypercholesteremia   . MRSA (methicillin resistant staph aureus) culture positive     Past Surgical History  Procedure Date  . Cardaic ablasion     Family History  Problem Relation Age of Onset  . Hypertension Mother   . Heart failure Mother   . Stroke Father     History  Substance Use Topics  . Smoking status: Never Smoker   . Smokeless tobacco: Not on file  . Alcohol Use: Yes    OB History    Grav Para Term Preterm Abortions TAB SAB Ect Mult Living                  Review of Systems  All other systems reviewed and are negative.    Allergies  Sulfonamide derivatives  Home Medications   Current Outpatient Rx  Name Route Sig Dispense Refill  . BUPROPION HCL ER (XL) 300 MG PO TB24 Oral Take 300 mg by mouth daily.      Marland Kitchen CALCIUM PO Oral Take 1 tablet by mouth daily.      Marland Kitchen VITAMIN D 2000 UNITS PO CAPS Oral Take 1 capsule by mouth daily.      Marland Kitchen DEXMETHYLPHENIDATE HCL ER 20 MG PO CP24 Oral Take 20 mg by mouth 2 (two) times daily as needed. For focus.     Marland Kitchen DOXYCYCLINE HYCLATE 100 MG PO TABS Oral Take 100 mg by mouth 2 (two) times daily.      .  DROSPIRENONE-ETHINYL ESTRADIOL 3-0.02 MG PO TABS Oral Take 1 tablet by mouth daily.      Marland Kitchen LAMOTRIGINE 200 MG PO TABS Oral Take 200 mg by mouth daily.      . ADULT MULTIVITAMIN W/MINERALS CH Oral Take 1 tablet by mouth daily.        BP 126/93  Pulse 101  Temp(Src) 98 F (36.7 C) (Oral)  Resp 20  Ht 6' (1.829 m)  Wt 170 lb (77.111 kg)  BMI 23.06 kg/m2  SpO2 99%  LMP 04/15/2011  Physical Exam  Nursing note and vitals reviewed. Constitutional: She is oriented to person, place, and time. She appears well-developed and well-nourished. No distress.  HENT:  Head: Normocephalic and atraumatic.  Eyes: Pupils are equal, round, and reactive to light.  Neck: Normal range of motion.  Cardiovascular: Normal rate and intact distal pulses.   Pulmonary/Chest: No respiratory distress.  Abdominal: Normal appearance. She exhibits no distension.  Musculoskeletal: Normal range of motion.  Neurological: She is alert and oriented to person, place, and time. She has  normal strength. A sensory deficit is present. No cranial nerve deficit. Gait normal. GCS eye subscore is 4. GCS verbal subscore is 5. GCS motor subscore is 6.  Skin: Skin is warm and dry. No rash noted.  Psychiatric: She has a normal mood and affect. Her behavior is normal.    ED Course  Procedures (including critical care time)  Labs Reviewed  POCT I-STAT, CHEM 8 - Abnormal; Notable for the following:    Glucose, Bld 103 (*)    All other components within normal limits  LAB REPORT - SCANNED   Dg Sacrum/coccyx  05/14/2011  *RADIOLOGY REPORT*  Clinical Data: Larey Seat down steps.  Pain.  SACRUM AND COCCYX - 2+ VIEW  Comparison: 06/30/2008, lumbar spine and sacral series  Findings: There is no evidence for acute fracture or dislocation. No soft tissue foreign body or gas identified.  Visualized portion of the pelvis is negative.  IMPRESSION: Negative exam.  Original Report Authenticated By: Patterson Hammersmith, M.D.   Ct Head Wo  Contrast  05/14/2011  *RADIOLOGY REPORT*  Clinical Data: With the patient slipped and fell down stairs. History of headache, hypertension.  Dizziness.  CT HEAD WITHOUT CONTRAST  Technique:  Contiguous axial images were obtained from the base of the skull through the vertex without contrast.  Comparison: None.  Findings: There is no intra or extra-axial fluid collection or mass lesion.  The basilar cisterns and ventricles have a normal appearance.  There is no CT evidence for acute infarction or hemorrhage.  Bone windows show no calvarial fracture.  Visualized paranasal and mastoid air cells are well-aerated.  IMPRESSION: Negative exam.  Original Report Authenticated By: Patterson Hammersmith, M.D.     1. Fall   2. Dizziness       MDM          Nelia Shi, MD 05/15/11 1610  Nelia Shi, MD 08/10/11 2241

## 2013-07-05 ENCOUNTER — Emergency Department (HOSPITAL_BASED_OUTPATIENT_CLINIC_OR_DEPARTMENT_OTHER)
Admission: EM | Admit: 2013-07-05 | Discharge: 2013-07-05 | Disposition: A | Discharge status: Home / Self Care | Payer: PRIVATE HEALTH INSURANCE | Attending: Emergency Medicine | Admitting: Emergency Medicine

## 2013-07-05 ENCOUNTER — Encounter (HOSPITAL_BASED_OUTPATIENT_CLINIC_OR_DEPARTMENT_OTHER): Payer: Self-pay | Admitting: Emergency Medicine

## 2013-07-05 ENCOUNTER — Emergency Department (HOSPITAL_BASED_OUTPATIENT_CLINIC_OR_DEPARTMENT_OTHER): Payer: PRIVATE HEALTH INSURANCE

## 2013-07-05 DIAGNOSIS — Z8614 Personal history of Methicillin resistant Staphylococcus aureus infection: Secondary | ICD-10-CM | POA: Insufficient documentation

## 2013-07-05 DIAGNOSIS — Z8639 Personal history of other endocrine, nutritional and metabolic disease: Secondary | ICD-10-CM | POA: Insufficient documentation

## 2013-07-05 DIAGNOSIS — Z79899 Other long term (current) drug therapy: Secondary | ICD-10-CM | POA: Insufficient documentation

## 2013-07-05 DIAGNOSIS — Z862 Personal history of diseases of the blood and blood-forming organs and certain disorders involving the immune mechanism: Secondary | ICD-10-CM | POA: Insufficient documentation

## 2013-07-05 DIAGNOSIS — I10 Essential (primary) hypertension: Secondary | ICD-10-CM | POA: Insufficient documentation

## 2013-07-05 DIAGNOSIS — Z3202 Encounter for pregnancy test, result negative: Secondary | ICD-10-CM | POA: Insufficient documentation

## 2013-07-05 DIAGNOSIS — K5900 Constipation, unspecified: Secondary | ICD-10-CM

## 2013-07-05 DIAGNOSIS — N390 Urinary tract infection, site not specified: Secondary | ICD-10-CM | POA: Insufficient documentation

## 2013-07-05 LAB — CBC WITH DIFFERENTIAL/PLATELET
BASOS ABS: 0 10*3/uL (ref 0.0–0.1)
BASOS PCT: 0 % (ref 0–1)
Eosinophils Absolute: 0.4 10*3/uL (ref 0.0–0.7)
Eosinophils Relative: 8 % — ABNORMAL HIGH (ref 0–5)
HEMATOCRIT: 40 % (ref 36.0–46.0)
Hemoglobin: 13.3 g/dL (ref 12.0–15.0)
Lymphocytes Relative: 34 % (ref 12–46)
Lymphs Abs: 1.7 10*3/uL (ref 0.7–4.0)
MCH: 29.8 pg (ref 26.0–34.0)
MCHC: 33.3 g/dL (ref 30.0–36.0)
MCV: 89.5 fL (ref 78.0–100.0)
MONO ABS: 0.6 10*3/uL (ref 0.1–1.0)
Monocytes Relative: 12 % (ref 3–12)
NEUTROS ABS: 2.3 10*3/uL (ref 1.7–7.7)
NEUTROS PCT: 46 % (ref 43–77)
Platelets: 206 10*3/uL (ref 150–400)
RBC: 4.47 MIL/uL (ref 3.87–5.11)
RDW: 12.2 % (ref 11.5–15.5)
WBC: 4.9 10*3/uL (ref 4.0–10.5)

## 2013-07-05 LAB — URINE MICROSCOPIC-ADD ON

## 2013-07-05 LAB — URINALYSIS, ROUTINE W REFLEX MICROSCOPIC
Bilirubin Urine: NEGATIVE
Glucose, UA: NEGATIVE mg/dL
Hgb urine dipstick: NEGATIVE
Ketones, ur: NEGATIVE mg/dL
Nitrite: NEGATIVE
PROTEIN: NEGATIVE mg/dL
Specific Gravity, Urine: 1.014 (ref 1.005–1.030)
UROBILINOGEN UA: 0.2 mg/dL (ref 0.0–1.0)
pH: 8 (ref 5.0–8.0)

## 2013-07-05 LAB — COMPREHENSIVE METABOLIC PANEL
ALBUMIN: 3.9 g/dL (ref 3.5–5.2)
ALT: 11 U/L (ref 0–35)
AST: 17 U/L (ref 0–37)
Alkaline Phosphatase: 78 U/L (ref 39–117)
BILIRUBIN TOTAL: 0.3 mg/dL (ref 0.3–1.2)
BUN: 13 mg/dL (ref 6–23)
CHLORIDE: 104 meq/L (ref 96–112)
CO2: 27 mEq/L (ref 19–32)
CREATININE: 0.8 mg/dL (ref 0.50–1.10)
Calcium: 9.2 mg/dL (ref 8.4–10.5)
GFR calc Af Amer: >90 mL/min (ref >90)
GFR calc non Af Amer: 87 mL/min — ABNORMAL LOW (ref >90)
Glucose, Bld: 98 mg/dL (ref 70–99)
Potassium: 4.2 mEq/L (ref 3.7–5.3)
Sodium: 144 mEq/L (ref 137–147)
TOTAL PROTEIN: 7 g/dL (ref 6.0–8.3)

## 2013-07-05 LAB — PREGNANCY, URINE: PREG TEST UR: NEGATIVE

## 2013-07-05 MED ORDER — FLEET ENEMA 7-19 GM/118ML RE ENEM
1.0000 | ENEMA | Freq: Once | RECTAL | Status: AC
  Administered 2013-07-05: 1 via RECTAL

## 2013-07-05 MED ORDER — CIPROFLOXACIN HCL 250 MG PO TABS: 250.0000 mg | ORAL_TABLET | Freq: Two times a day (BID) | ORAL | Status: DC

## 2013-07-05 MED ORDER — FLEET ENEMA 7-19 GM/118ML RE ENEM
ENEMA | RECTAL | Status: AC
  Filled 2013-07-05: qty 1

## 2013-07-05 NOTE — ED Notes (Signed)
Pt c/o constipation x 2 months, Gastro appt Monday @0830  pt has severe abd pain last BM x 2 weeks ago

## 2013-07-05 NOTE — Discharge Instructions (Signed)
Constipation, Adult Constipation is when a person has fewer than 3 bowel movements a week; has difficulty having a bowel movement; or has stools that are dry, hard, or larger than normal. As people grow older, constipation is more common. If you try to fix constipation with medicines that make you have a bowel movement (laxatives), the problem may get worse. Long-term laxative use may cause the muscles of the colon to become weak. A low-fiber diet, not taking in enough fluids, and taking certain medicines may make constipation worse. CAUSES   Certain medicines, such as antidepressants, pain medicine, iron supplements, antacids, and water pills.   Certain diseases, such as diabetes, irritable bowel syndrome (IBS), thyroid disease, or depression.   Not drinking enough water.   Not eating enough fiber-rich foods.   Stress or travel.  Lack of physical activity or exercise.  Not going to the restroom when there is the urge to have a bowel movement.  Ignoring the urge to have a bowel movement.  Using laxatives too much. SYMPTOMS   Having fewer than 3 bowel movements a week.   Straining to have a bowel movement.   Having hard, dry, or larger than normal stools.   Feeling full or bloated.   Pain in the lower abdomen.  Not feeling relief after having a bowel movement. DIAGNOSIS  Your caregiver will take a medical history and perform a physical exam. Further testing may be done for severe constipation. Some tests may include:   A barium enema X-ray to examine your rectum, colon, and sometimes, your small intestine.  A sigmoidoscopy to examine your lower colon.  A colonoscopy to examine your entire colon. TREATMENT  Treatment will depend on the severity of your constipation and what is causing it. Some dietary treatments include drinking more fluids and eating more fiber-rich foods. Lifestyle treatments may include regular exercise. If these diet and lifestyle recommendations  do not help, your caregiver may recommend taking over-the-counter laxative medicines to help you have bowel movements. Prescription medicines may be prescribed if over-the-counter medicines do not work.  HOME CARE INSTRUCTIONS   Increase dietary fiber in your diet, such as fruits, vegetables, whole grains, and beans. Limit high-fat and processed sugars in your diet, such as [place] fries, hamburgers, cookies, candies, and soda.   A fiber supplement may be added to your diet if you cannot get enough fiber from foods.   Drink enough fluids to keep your urine clear or pale yellow.   Exercise regularly or as directed by your caregiver.   Go to the restroom when you have the urge to go. Do not hold it.  Only take medicines as directed by your caregiver. Do not take other medicines for constipation without talking to your caregiver first. SEEK IMMEDIATE MEDICAL CARE IF:   You have bright red blood in your stool.   Your constipation lasts for more than 4 days or gets worse.   You have abdominal or rectal pain.   You have thin, pencil-like stools.  You have unexplained weight loss. MAKE SURE YOU:   Understand these instructions.  Will watch your condition.  Will get help right away if you are not doing well or get worse. Document Released: 02/12/2004 Document Revised: 08/08/2011 Document Reviewed: 02/25/2013 [facility]Exit[facility] Patient Information 2014 [place]ExitCare, [place].   Fleets enema OTC Keep GI appt for Monday Drink plenty of fluids

## 2013-07-05 NOTE — ED Provider Notes (Signed)
CSN: [id number]     Arrival date & time 07/05/13  1317 History   First MD Initiated Contact with Patient 07/05/13 1334     Chief Complaint  Patient presents with  . Constipation   (Consider location/radiation/quality/duration/timing/severity/associated sxs/prior Treatment) Patient is a 48 y.o. female presenting with constipation. The history is provided by the patient. No language interpreter was used.  Constipation Severity:  Moderate Associated symptoms: no abdominal pain, no back pain, no dysuria, no fever, no nausea and no vomiting    Pt is a 48 year old female who presents this afternoon with a history of constipation. She reports that she has had problems with constipation before and has recently made some dietary changes to facilitate a more normal bowel movement. She also reports that she has started exercising recently. She denies having any GI problems except for occasional constipation. She reports that she has not had a "good" bowel movement in the last two weeks. She reports that she did have some periods of watery diarrhea 2 days ago. She denies fever, chills, vomiting, nausea or abdominal pain. She reports that she feels bloated. No recent suspicious food intake or known sick exposure. She reports that she has tried milk of magnesia, colace and miralax at home. She denies any recent surgery and she reports that she has a GI appt on Monday.    Past Medical History  Diagnosis Date  . Hypertension   . Hypercholesteremia   . MRSA (methicillin resistant staph aureus) culture positive    Past Surgical History  Procedure Laterality Date  . Cardaic ablasion     Family History  Problem Relation Age of Onset  . Hypertension Mother   . Heart failure Mother   . Stroke Father    History  Substance Use Topics  . Smoking status: Never Smoker   . Smokeless tobacco: Not on file  . Alcohol Use: Yes   OB History   Grav Para Term Preterm Abortions TAB SAB Ect Mult Living                  Review of Systems  Constitutional: Negative for fever, chills and appetite change.  Gastrointestinal: Positive for constipation. Negative for nausea, vomiting, abdominal pain, blood in stool and abdominal distention.  Genitourinary: Negative for dysuria and difficulty urinating.  Musculoskeletal: Negative for back pain.  All other systems reviewed and are negative.    Allergies  Sulfonamide derivatives  Home Medications   Current Outpatient Rx  Name  Route  Sig  Dispense  Refill  . buPROPion (WELLBUTRIN XL) 300 MG 24 hr tablet   Oral   Take 300 mg by mouth daily.           Marland Kitchen CALCIUM PO   Oral   Take 1 tablet by mouth daily.           . Cholecalciferol (VITAMIN D) 2000 UNITS CAPS   Oral   Take 1 capsule by mouth daily.           Marland Kitchen dexmethylphenidate (FOCALIN XR) 20 MG 24 hr capsule   Oral   Take 20 mg by mouth 2 (two) times daily as needed. For focus.          Marland Kitchen doxycycline (VIBRA-TABS) 100 MG tablet   Oral   Take 100 mg by mouth 2 (two) times daily.           . drospirenone-ethinyl estradiol (YAZ,GIANVI,LORYNA) 3-0.02 MG tablet   Oral   Take 1 tablet by  mouth daily.           Marland Kitchen. lamoTRIgine (LAMICTAL) 200 MG tablet   Oral   Take 200 mg by mouth daily.           . Multiple Vitamin (MULITIVITAMIN WITH MINERALS) TABS   Oral   Take 1 tablet by mouth daily.            BP 148/94  Pulse 82  Temp(Src) 97.7 F (36.5 C) (Oral)  Resp 21  Ht 6' (1.829 m)  Wt 185 lb (83.915 kg)  BMI 25.08 kg/m2  SpO2 99% Physical Exam  Nursing note and vitals reviewed. Constitutional: She is oriented to person, place, and time. She appears well-developed and well-nourished. No distress.  HENT:  Head: Normocephalic and atraumatic.  Eyes: Conjunctivae and EOM are normal.  Neck: Normal range of motion. Neck supple. No tracheal deviation present. No thyromegaly present.  Cardiovascular: Normal rate, regular rhythm, normal heart sounds and intact distal pulses.    Pulmonary/Chest: Effort normal and breath sounds normal.  Abdominal: Soft. Bowel sounds are normal. She exhibits no distension. There is no tenderness. There is no rebound and no guarding.  Musculoskeletal: Normal range of motion.  Neurological: She is alert and oriented to person, place, and time.  Skin: Skin is warm and dry.  Psychiatric: She has a normal mood and affect. Her behavior is normal. Judgment and thought content normal.    ED Course  Procedures (including critical care time) Labs Review Labs Reviewed  URINALYSIS, ROUTINE W REFLEX MICROSCOPIC - Abnormal; Notable for the following:    APPearance TURBID (*)    Leukocytes, UA SMALL (*)    All other components within normal limits  URINE MICROSCOPIC-ADD ON - Abnormal; Notable for the following:    Squamous Epithelial / LPF FEW (*)    Bacteria, UA FEW (*)    All other components within normal limits  URINE CULTURE  PREGNANCY, URINE   Imaging Review No results found.  EKG Interpretation   None       MDM   1. Constipation   2. UTI (lower urinary tract infection)     Reassuring abdominal exam. No peritoneal signs, pain or tenderness. Afebrile and pt in no distress.  AAS with unremarkable bowel gas pattern, no obstruction. Small leuks and bacteria in urine, will treat for UTI with Cipro. Follow-up with GI on Monday. Pt sent home with fleets enema to use at home. Return precautions given and pt agrees to plan.      Irish EldersKelly Danica Camarena, NP 07/12/13 1148

## 2013-07-06 LAB — URINE CULTURE
COLONY COUNT: NO GROWTH
CULTURE: NO GROWTH

## 2013-07-12 NOTE — ED Provider Notes (Signed)
Medical screening examination/treatment/procedure(s) were performed by non-physician practitioner and as supervising physician I was immediately available for consultation/collaboration.  EKG Interpretation   None         Gwyneth SproutWhitney Jerelene Salaam, MD 07/12/13 [phone]

## 2014-09-18 ENCOUNTER — Emergency Department (HOSPITAL_COMMUNITY)
Admission: EM | Admit: 2014-09-18 | Discharge: 2014-09-18 | Disposition: A | Discharge status: Home / Self Care | Payer: PRIVATE HEALTH INSURANCE | Attending: Emergency Medicine | Admitting: Emergency Medicine

## 2014-09-18 ENCOUNTER — Emergency Department (HOSPITAL_COMMUNITY): Payer: PRIVATE HEALTH INSURANCE

## 2014-09-18 ENCOUNTER — Encounter (HOSPITAL_COMMUNITY): Payer: Self-pay | Admitting: *Deleted

## 2014-09-18 DIAGNOSIS — Z7952 Long term (current) use of systemic steroids: Secondary | ICD-10-CM | POA: Diagnosis not present

## 2014-09-18 DIAGNOSIS — R21 Rash and other nonspecific skin eruption: Secondary | ICD-10-CM | POA: Diagnosis not present

## 2014-09-18 DIAGNOSIS — Z79899 Other long term (current) drug therapy: Secondary | ICD-10-CM | POA: Diagnosis not present

## 2014-09-18 DIAGNOSIS — I10 Essential (primary) hypertension: Secondary | ICD-10-CM | POA: Diagnosis not present

## 2014-09-18 DIAGNOSIS — R51 Headache: Secondary | ICD-10-CM | POA: Diagnosis not present

## 2014-09-18 DIAGNOSIS — R110 Nausea: Secondary | ICD-10-CM | POA: Insufficient documentation

## 2014-09-18 DIAGNOSIS — Z8614 Personal history of Methicillin resistant Staphylococcus aureus infection: Secondary | ICD-10-CM | POA: Insufficient documentation

## 2014-09-18 DIAGNOSIS — Z8639 Personal history of other endocrine, nutritional and metabolic disease: Secondary | ICD-10-CM | POA: Diagnosis not present

## 2014-09-18 DIAGNOSIS — R000 Tachycardia, unspecified: Secondary | ICD-10-CM

## 2014-09-18 DIAGNOSIS — R079 Chest pain, unspecified: Secondary | ICD-10-CM | POA: Diagnosis not present

## 2014-09-18 DIAGNOSIS — G479 Sleep disorder, unspecified: Secondary | ICD-10-CM | POA: Diagnosis not present

## 2014-09-18 LAB — COMPREHENSIVE METABOLIC PANEL
ALT: 16 U/L (ref 0–35)
AST: 18 U/L (ref 0–37)
Albumin: 4 g/dL (ref 3.5–5.2)
Alkaline Phosphatase: 72 U/L (ref 39–117)
Anion gap: 8 (ref 5–15)
BUN: 11 mg/dL (ref 6–23)
CO2: 29 mmol/L (ref 19–32)
Calcium: 9.4 mg/dL (ref 8.4–10.5)
Chloride: 102 mmol/L (ref 96–112)
Creatinine, Ser: 0.81 mg/dL (ref 0.50–1.10)
GFR calc Af Amer: >90 mL/min (ref >90)
GFR calc non Af Amer: 85 mL/min — ABNORMAL LOW (ref >90)
Glucose, Bld: 102 mg/dL — ABNORMAL HIGH (ref 70–99)
Potassium: 4.1 mmol/L (ref 3.5–5.1)
Sodium: 139 mmol/L (ref 135–145)
Total Bilirubin: 0.5 mg/dL (ref 0.3–1.2)
Total Protein: 6.7 g/dL (ref 6.0–8.3)

## 2014-09-18 LAB — URINALYSIS, ROUTINE W REFLEX MICROSCOPIC
Bilirubin Urine: NEGATIVE
Glucose, UA: NEGATIVE mg/dL
Hgb urine dipstick: NEGATIVE
Ketones, ur: NEGATIVE mg/dL
Leukocytes, UA: NEGATIVE
NITRITE: NEGATIVE
Protein, ur: NEGATIVE mg/dL
SPECIFIC GRAVITY, URINE: 1.013 (ref 1.005–1.030)
UROBILINOGEN UA: 0.2 mg/dL (ref 0.0–1.0)
pH: 8 (ref 5.0–8.0)

## 2014-09-18 LAB — CBC WITH DIFFERENTIAL/PLATELET
BASOS ABS: 0 10*3/uL (ref 0.0–0.1)
BASOS PCT: 0 % (ref 0–1)
Eosinophils Absolute: 0 10*3/uL (ref 0.0–0.7)
Eosinophils Relative: 0 % (ref 0–5)
HCT: 45.5 % (ref 36.0–46.0)
Hemoglobin: 14.9 g/dL (ref 12.0–15.0)
Lymphocytes Relative: 14 % (ref 12–46)
Lymphs Abs: 1.3 10*3/uL (ref 0.7–4.0)
MCH: 28.8 pg (ref 26.0–34.0)
MCHC: 32.7 g/dL (ref 30.0–36.0)
MCV: 88 fL (ref 78.0–100.0)
MONOS PCT: 5 % (ref 3–12)
Monocytes Absolute: 0.5 10*3/uL (ref 0.1–1.0)
NEUTROS ABS: 7.2 10*3/uL (ref 1.7–7.7)
NEUTROS PCT: 81 % — AB (ref 43–77)
Platelets: 265 10*3/uL (ref 150–400)
RBC: 5.17 MIL/uL — AB (ref 3.87–5.11)
RDW: 12.7 % (ref 11.5–15.5)
WBC: 9 10*3/uL (ref 4.0–10.5)

## 2014-09-18 LAB — I-STAT TROPONIN, ED
TROPONIN I, POC: 0 ng/mL (ref 0.00–0.08)
TROPONIN I, POC: 0 ng/mL (ref 0.00–0.08)

## 2014-09-18 LAB — D-DIMER, QUANTITATIVE: D-Dimer, Quant: <0.27 ug/mL-FEU (ref 0.00–0.48)

## 2014-09-18 NOTE — ED Provider Notes (Signed)
Medical screening examination/treatment/procedure(s) were conducted as a shared visit with non-physician practitioner(s) and myself.  I personally evaluated the patient during the encounter.   EKG Interpretation   Date/Time:  Thursday September 18 2014 16:00:11 EDT Ventricular Rate:  115 PR Interval:  130 QRS Duration: 84 QT Interval:  310 QTC Calculation: 428 R Axis:   93 Text Interpretation:  Sinus tachycardia Right atrial enlargement Rightward  axis Cannot rule out Anterior infarct , age undetermined Abnormal ECG  Since last tracing rate faster Confirmed by Korrin Waterfield  MD-J, Doni Bacha ([id number]) on  09/18/2014 5:35:01 PM      Pt presents with tachycardiac and some chest discomofort as well.  Incidental headache.  Seen by PCP and sent to the ED.  Low risk for heart disease, heart score of 3.    Plan on CXR.  Repeat enzymes.  If negative plan on outpatient follow up with PCP.  Possible thyroid panel, outpatient stress test.    Linwood DibblesJon Rayelle Armor, MD 09/18/14 [phone]

## 2014-09-18 NOTE — Discharge Instructions (Signed)
Your chest x-ray, blood work and urine tonight are normal. Follow up with your doctor for additional medical screening such as thyroid studies. Return here as needed for worsening symptoms.   Nonspecific Tachycardia Tachycardia is a faster than normal heartbeat (more than 100 beats per minute). In adults, the heart normally beats between 60 and 100 times a minute. A fast heartbeat may be a normal response to exercise or stress. It does not necessarily mean that something is wrong. However, sometimes when your heart beats too fast it may not be able to pump enough blood to the rest of your body. This can result in chest pain, shortness of breath, dizziness, and even fainting. Nonspecific tachycardia means that the specific cause or pattern of your tachycardia is unknown. CAUSES  Tachycardia may be harmless or it may be due to a more serious underlying cause. Possible causes of tachycardia include:  Exercise or exertion.  Fever.  Pain or injury.  Infection.  Loss of body fluids (dehydration).  Overactive thyroid.  Lack of red blood cells (anemia).  Anxiety and stress.  Alcohol.  Caffeine.  Tobacco products.  Diet pills.  Illegal drugs.  Heart disease. SYMPTOMS  Rapid or irregular heartbeat (palpitations).  Suddenly feeling your heart beating (cardiac awareness).  Dizziness.  Tiredness (fatigue).  Shortness of breath.  Chest pain.  Nausea.  Fainting. DIAGNOSIS  Your caregiver will perform a physical exam and take your medical history. In some cases, a heart specialist (cardiologist) may be consulted. Your caregiver may also order:  Blood tests.  Electrocardiography. This test records the electrical activity of your heart.  A heart monitoring test. TREATMENT  Treatment will depend on the likely cause of your tachycardia. The goal is to treat the underlying cause of your tachycardia. Treatment methods may include:  Replacement of fluids or blood through an  intravenous (IV) tube for moderate to severe dehydration or anemia.  New medicines or changes in your current medicines.  Diet and lifestyle changes.  Treatment for certain infections.  Stress relief or relaxation methods. HOME CARE INSTRUCTIONS   Rest.  Drink enough fluids to keep your urine clear or pale yellow.  Do not smoke.  Avoid:  Caffeine.  Tobacco.  Alcohol.  Chocolate.  Stimulants such as over-the-counter diet pills or pills that help you stay awake.  Situations that cause anxiety or stress.  Illegal drugs such as marijuana, phencyclidine (PCP), and cocaine.  Only take medicine as directed by your caregiver.  Keep all follow-up appointments as directed by your caregiver. SEEK IMMEDIATE MEDICAL CARE IF:   You have pain in your chest, upper arms, jaw, or neck.  You become weak, dizzy, or feel faint.  You have palpitations that will not go away.  You vomit, have diarrhea, or pass blood in your stool.  Your skin is cool, pale, and wet.  You have a fever that will not go away with rest, fluids, and medicine. MAKE SURE YOU:   Understand these instructions.  Will watch your condition.  Will get help right away if you are not doing well or get worse. Document Released: 06/23/2004 Document Revised: 08/08/2011 Document Reviewed: 04/26/2011 [facility] Patient Information 2015 [place]ExitCare, [place]LLC. This information is not intended to replace advice given to you by your health care provider. Make sure you discuss any questions you have with your health care provider.

## 2014-09-18 NOTE — ED Provider Notes (Signed)
CSN: [id number]     Arrival date & time 09/18/14  1557 History   First MD Initiated Contact with Patient 09/18/14 1639     Chief Complaint  Patient presents with  . Tachycardia  . Headache     (Consider location/radiation/quality/duration/timing/severity/associated sxs/prior Treatment) The history is provided by the patient.   Soyla MurphyChristine H Feria is a 49 y.o. female who presents to the ED from her doctor's office for tachycardia. 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 She has been having problems sleeping for the past few nights and has had a throbbing feeling at times in her head. One day she noted that her face looked flushed. Last night she had nausea and a feeling like something was stuck in her throat. She has had some feeling of shortness of breath when she takes a deep breath. She has also had poison ivy that has had some bleeding and is sore. Hx of MRSA. She is concerned because there is a family hx of DVT's, her father has had a stroke and her mother MI. She has been taking prednisone for her poison ivy. She took ASA last night and again today.   Past Medical History  Diagnosis Date  . Hypertension   . Hypercholesteremia   . MRSA (methicillin resistant staph aureus) culture positive    Past Surgical History  Procedure Laterality Date  . Cardaic ablasion     Family History  Problem Relation Age of Onset  . Hypertension Mother   . Heart failure Mother   . Stroke Father    History  Substance Use Topics  . Smoking status: Never Smoker   . Smokeless tobacco: Not on file  . Alcohol Use: Yes   OB History    No data available     Review of Systems  Cardiovascular: Positive for chest pain.  Gastrointestinal: Positive for nausea.  Skin: Positive for rash (poison oak).  Neurological: Positive for headaches.  all other systems negative    Allergies  Sulfonamide derivatives  Home Medications    Prior to Admission medications   Medication Sig Start Date End Date Taking? Authorizing Provider  BRINTELLIX 20 MG TABS Take 20 mg by mouth daily. 08/17/14  Yes Historical Provider, MD  CALCIUM PO Take 1 tablet by mouth daily.     Yes Historical Provider, MD  Cholecalciferol (VITAMIN D) 2000 UNITS CAPS Take 1 capsule by mouth daily.     Yes Historical Provider, MD  dexmethylphenidate (FOCALIN XR) 20 MG 24 hr capsule Take 20 mg by mouth 2 (two) times daily as needed. For focus.    Yes Historical Provider, MD  doxycycline (MONODOX) 100 MG capsule Take 100 mg by mouth daily as needed (MERCER FLARE).  08/17/14  Yes Historical Provider, MD  lamoTRIgine (LAMICTAL) 200 MG tablet Take 200 mg by mouth daily.     Yes Historical Provider, MD  lisinopril (PRINIVIL,ZESTRIL) 10 MG tablet Take 10 mg by mouth daily. 09/12/14  Yes Historical Provider, MD  Multiple Vitamin (MULITIVITAMIN WITH MINERALS) TABS Take 1 tablet by mouth daily.     Yes Historical Provider, MD  predniSONE (DELTASONE) 5 MG tablet Take 5 mg by mouth daily. dosepak 09/07/14  Yes Historical Provider, MD  ciprofloxacin (CIPRO) 250 MG tablet Take 1 tablet (250 mg total) by mouth every 12 (twelve) hours. Patient not taking: Reported on 09/18/2014 07/05/13   Irish EldersKelly Walker, NP   BP 134/87 mmHg  Pulse 89  Temp(Src) 98.6 F (37 C) (Oral)  Resp 18  Ht 6' (1.829 m)  Wt 174 lb (78.926 kg)  BMI 23.59 kg/m2  SpO2 96%  LMP 04/15/2011 Physical Exam  Constitutional: She is oriented to person, place, and time. She appears well-developed and well-nourished.  HENT:  Head: Normocephalic and atraumatic.  Eyes: EOM are normal.  Neck: Normal range of motion. Neck supple. Normal carotid pulses and no JVD present. Carotid bruit is not present.  Cardiovascular: Tachycardia present.   Pulmonary/Chest: Effort normal. She has no wheezes. She has no rales.  Abdominal: Soft. There is no tenderness.  Musculoskeletal: Normal range of motion.  Neurological: She is  alert and oriented to person, place, and time. No cranial nerve deficit.  Skin: Skin is warm and dry.  Psychiatric: She has a normal mood and affect. Her behavior is normal.  Nursing note and vitals reviewed.   ED Course  Procedures (including critical care time) Labs Review Results for orders placed or performed during the hospital encounter of 09/18/14 (from the past 24 hour(s))  CBC with Differential     Status: Abnormal   Collection Time: 09/18/14  5:47 PM  Result Value Ref Range   WBC 9.0 4.0 - 10.5 K/uL   RBC 5.17 (H) 3.87 - 5.11 MIL/uL   Hemoglobin 14.9 12.0 - 15.0 g/dL   HCT 96.0 45.4 - 09.8 %   MCV 88.0 78.0 - 100.0 fL   MCH 28.8 26.0 - 34.0 pg   MCHC 32.7 30.0 - 36.0 g/dL   RDW 11.9 14.7 - 82.9 %   Platelets 265 150 - 400 K/uL   Neutrophils Relative % 81 (H) 43 - 77 %   Neutro Abs 7.2 1.7 - 7.7 K/uL   Lymphocytes Relative 14 12 - 46 %   Lymphs Abs 1.3 0.7 - 4.0 K/uL   Monocytes Relative 5 3 - 12 %   Monocytes Absolute 0.5 0.1 - 1.0 K/uL   Eosinophils Relative 0 0 - 5 %   Eosinophils Absolute 0.0 0.0 - 0.7 K/uL   Basophils Relative 0 0 - 1 %   Basophils Absolute 0.0 0.0 - 0.1 K/uL  D-dimer, quantitative     Status: None   Collection Time: 09/18/14  5:47 PM  Result Value Ref Range   D-Dimer, Quant <0.27 0.00 - 0.48 ug/mL-FEU  Comprehensive metabolic panel     Status: Abnormal   Collection Time: 09/18/14  5:47 PM  Result Value Ref Range   Sodium 139 135 - 145 mmol/L   Potassium 4.1 3.5 - 5.1 mmol/L   Chloride 102 96 - 112 mmol/L   CO2 29 19 - 32 mmol/L   Glucose, Bld 102 (H) 70 - 99 mg/dL   BUN 11 6 - 23 mg/dL   Creatinine, Ser 5.62 0.50 - 1.10 mg/dL   Calcium 9.4 8.4 - 13.0 mg/dL   Total Protein 6.7 6.0 - 8.3 g/dL   Albumin 4.0 3.5 - 5.2 g/dL   AST 18 0 - 37 U/L   ALT 16 0 - 35 U/L   Alkaline Phosphatase 72 39 - 117 U/L   Total Bilirubin 0.5 0.3 - 1.2 mg/dL   GFR calc non Af Amer 85 (L) >90 mL/min   GFR calc Af Amer >90 >90 mL/min   Anion gap 8 5 - 15   I-stat troponin, ED     Status: None   Collection Time: 09/18/14  5:54 PM  Result Value Ref Range   Troponin i,  poc 0.00 0.00 - 0.08 ng/mL   Comment 3          Urinalysis, Routine w reflex microscopic     Status: None   Collection Time: 09/18/14  7:02 PM  Result Value Ref Range   Color, Urine YELLOW YELLOW   APPearance CLEAR CLEAR   Specific Gravity, Urine 1.013 1.005 - 1.030   pH 8.0 5.0 - 8.0   Glucose, UA NEGATIVE NEGATIVE mg/dL   Hgb urine dipstick NEGATIVE NEGATIVE   Bilirubin Urine NEGATIVE NEGATIVE   Ketones, ur NEGATIVE NEGATIVE mg/dL   Protein, ur NEGATIVE NEGATIVE mg/dL   Urobilinogen, UA 0.2 0.0 - 1.0 mg/dL   Nitrite NEGATIVE NEGATIVE   Leukocytes, UA NEGATIVE NEGATIVE  I-stat troponin, ED     Status: None   Collection Time: 09/18/14  7:54 PM  Result Value Ref Range   Troponin i, poc 0.00 0.00 - 0.08 ng/mL   Comment 3            Imaging Review Dg Chest 2 View  09/18/2014   CLINICAL DATA:  Tachycardia. Headache. Shortness of breath. History of heart ablation.  EXAM: CHEST  2 VIEW  COMPARISON:  07/05/2013; 05/19/2004  FINDINGS: Unchanged cardiac silhouette and mediastinal contours. No focal parenchymal opacities. No pleural effusion or pneumothorax. No evidence of edema. No acute osseus abnormalities.  IMPRESSION: No acute cardiopulmonary disease.   Electronically Signed   By: Simonne Come M.D.   On: 09/18/2014 20:33     EKG Interpretation   Date/Time:  Thursday September 18 2014 16:00:11 EDT Ventricular Rate:  115 PR Interval:  130 QRS Duration: 84 QT Interval:  310 QTC Calculation: 428 R Axis:   93 Text Interpretation:  Sinus tachycardia Right atrial enlargement Rightward  axis Cannot rule out Anterior infarct , age undetermined Abnormal ECG  Since last tracing rate faster Confirmed by KNAPP  MD-J, JON ([id number]) on  09/18/2014 5:35:01 PM      MDM  49 y.o. female with tachycardia and chest discomfort that started a few days ago and got worse today. Stable for d/c  negative troponin I, CXR and labs. Patient evaluated by me and by Dr. Lynelle Doctor. Plan for patient to follow up with PCP for possible thyroid panel and out patient stress test. Discussed with the patient clinical, lab, EKG and X-ray findings and all questioned fully answered. She follow up with her PCP or return here if any problems arise.  Final diagnoses:  Tachycardia       [facility], NP 09/18/14 6045  Linwood Dibbles, MD 09/18/14 [phone]

## 2014-09-18 NOTE — ED Notes (Signed)
Pt reports intermittent throbbing headache for 2-3 days. Pt states headaches get worse in the evening around 5 pm. Pt reports nausea, a decrease in appetite and lethargy with the headaches. Pt denies v/d.

## 2014-10-22 ENCOUNTER — Ambulatory Visit: Payer: PRIVATE HEALTH INSURANCE | Admitting: Cardiology

## 2015-05-31 DIAGNOSIS — E875 Hyperkalemia: Secondary | ICD-10-CM

## 2015-05-31 HISTORY — PX: CERVICAL FUSION: SHX112

## 2015-05-31 HISTORY — DX: Hyperkalemia: E87.5

## 2015-06-17 ENCOUNTER — Ambulatory Visit: Payer: PRIVATE HEALTH INSURANCE | Attending: Neurosurgery | Admitting: Physical Therapy

## 2015-06-17 DIAGNOSIS — M542 Cervicalgia: Secondary | ICD-10-CM | POA: Insufficient documentation

## 2015-06-17 DIAGNOSIS — M25519 Pain in unspecified shoulder: Secondary | ICD-10-CM | POA: Diagnosis present

## 2015-06-17 DIAGNOSIS — R29898 Other symptoms and signs involving the musculoskeletal system: Secondary | ICD-10-CM | POA: Diagnosis present

## 2015-06-17 DIAGNOSIS — R293 Abnormal posture: Secondary | ICD-10-CM | POA: Diagnosis present

## 2015-06-17 NOTE — Therapy (Signed)
[facility] [address] [place], [place], [address] Phone: [phone]   Fax:  [phone]  Physical Therapy Evaluation  Patient Details  Name: Kayla Ortiz MRN: [id number] Date of Birth: 1966-04-21 Referring Provider: Julio Sicks, MD  Encounter Date: 06/17/2015      PT End of Session - 06/17/15 0837    Visit Number 1   Number of Visits 8   Date for PT Re-Evaluation 07/15/15   PT Start Time 0744   PT Stop Time 0837   PT Time Calculation (min) 53 min   Activity Tolerance Patient tolerated treatment well   Behavior During Therapy [facility] for tasks assessed/performed      Past Medical History  Diagnosis Date  . Hypertension   . Hypercholesteremia   . MRSA (methicillin resistant staph aureus) culture positive     Past Surgical History  Procedure Laterality Date  . Cardaic ablasion      There were no vitals filed for this visit.  Visit Diagnosis:  Neck and shoulder pain  Muscle pain, cervical  Posture abnormality      Subjective Assessment - 06/17/15 0748    Subjective Pt is a 50 y/o female with midline cervical pain between the scapulas. Pt also reports R UE and shoulder pain that radiates into the fingers resulting in tingling. No traumatic MOI with pt stating posture at work main reason for pain. Pt reported past history of intermittent cervical pain with an exacerbation of constant cervical pain which radiates starting in December. Pt mentioned she had "a hump" describing her increased thoracic kyphosis and wanting to prevent worsning of it. Pt also explained a 1 time PT consultation for the pain but due to disatisfaction with service wanted to go through another provider. Pt reported pain limiting her ability to work and that she "had to leave work early" for it. History of 2 steroid packs which pt stated "did not really help".   Pertinent History hypertension   Limitations Sitting   How long can you sit  comfortably? 2 hours with head down    How long can you walk comfortably? 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 Yes   Pain Score 2   worst; 7/10   Pain Location Neck   Pain Orientation Mid   Pain Descriptors / Indicators Shooting;Burning;Tightness   Pain Type Chronic pain   Pain Radiating Towards L and R arm fingers; R shoulder pain    Pain Onset 1 to 4 weeks ago   Pain Frequency Constant   Aggravating Factors  sleeping (especially turning on R shoulder); sitting slumped over; turning head   Pain Relieving Factors ice; stretches (initially)   Effect of Pain on Daily Activities work            [facility] PT Assessment - 06/17/15 0800    Assessment   Medical Diagnosis Cervical radiculopathy   Referring Provider Julio Sicks, MD   Onset Date/Surgical Date 04/30/15  Exacerbation in December 2016   Hand Dominance Right   Next MD Visit 07/01/2015   Prior Therapy Consultation but no follow through   Precautions   Precautions None   Restrictions   Weight Bearing Restrictions No   Balance Screen   Has the patient fallen in the past 6 months No   Has the patient had a decrease in activity level because of a fear of falling?  No  Is the patient reluctant to leave their home because of a fear of falling?  No   Prior Function   Level of Independence Independent   Vocation Full time employment   Vocation Requirements sitting at a desk   Leisure volleyball; aerobic walking exercise   Observation/Other Assessments   Focus on Therapeutic Outcomes (FOTO)  61% (39% limited; predicted 31% limited)   Posture/Postural Control   Posture/Postural Control Postural limitations   Postural Limitations Rounded Shoulders;Forward head;Increased thoracic kyphosis;Flexed trunk   Posture Comments computer posture with excessive forward head and rounded shoulders   AROM   Cervical Flexion 72   Cervical Extension 52  leads with  chin; poor anterior cervical muscular control   Cervical - Right Side Bend 34   Cervical - Left Side Bend 48  pulling at 40   Cervical - Right Rotation 55  with pain at end range   Cervical - Left Rotation 58   Strength   Right Shoulder Flexion 5/5   Right Shoulder ABduction 3/5  limited by pain   Right Shoulder Internal Rotation 5/5   Right Shoulder External Rotation 5/5   Left Shoulder Flexion 5/5   Left Shoulder ABduction 5/5   Left Shoulder Internal Rotation 5/5   Left Shoulder External Rotation 5/5   Palpation   Palpation comment tenderness and tightness on R upper trapezius                   [facility] Adult PT Treatment/Exercise - 06/17/15 0800    Modalities   Modalities Traction   Traction   Type of Traction Cervical   Min (lbs) 10   Max (lbs) 15   Hold Time 60 sec   Rest Time 20 sec   Time 15                  PT Short Term Goals - 06/17/15 0858    PT SHORT TERM GOAL #1   Title Pt will be independent with HEP (07/01/2015)   Time 2   Period Weeks   Status New   PT SHORT TERM GOAL #2   Title Pt will demonstrate correct sitting posture to prevent further injury (07/01/2015)   Time 2   Period Weeks   Status New           PT Long Term Goals - 06/17/15 1610    PT LONG TERM GOAL #1   Title Pt will stand >/= 30 minutes without R UE tingling and without an increase in pain in order to return to exercise program (07/15/2015)   Time 4   Period Weeks   Status New   PT LONG TERM GOAL #2   Title Pt will sit >/= 4 hours without an increase in symptoms with intermittent breaks in order to meet work requirements (07/15/2015)   Time 4   Period Weeks   Status New   PT LONG TERM GOAL #3   Title Pt will improve cervical rotation ROM bilaterally to 0-70 degrees without an increase in 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 Sitting and standing posture poor, especially with work requirement of sitting over a computer. Pt ROM limited due to myofascial tightness and pain. Pt  given traction to relieve radicular symptoms. Pt reported it "felt good" and will follow up next appointment. Pt will benefit from OPPT to recieve manual therapy to relieve muscle tightness and spasms along with posture education  in order to improve ROM, minor strenght deficits, and prevent further injury. Pt also has LBP but not assessed at this time due to no direct orders for that.   Pt will benefit from skilled therapeutic intervention in order to improve on the following deficits Pain;Postural dysfunction;Increased muscle spasms;Decreased range of motion;Decreased strength;Increased fascial restricitons   Rehab Potential Good   PT Frequency 2x / week   PT Duration 4 weeks   PT Treatment/Interventions Cryotherapy;Manual techniques;Moist Heat;Traction;Patient/family education;Ultrasound;Electrical Stimulation;Therapeutic exercise;Functional mobility training   PT Next Visit Plan DN; HEP; stretching    Consulted and Agree with Plan of Care Patient         Problem List Patient Active Problem List   Diagnosis Date Noted  . DEPRESSION 02/05/2007  . SYMPTOM, RASH, OTH NONSPECIFIC SKIN ERUPTION 12/06/2006  . SHOULDER PAIN, RIGHT 08/30/2006  . MRSA INFECTION 07/20/2006  . HERPES SIMPLEX, UNCOMPLICATED 07/20/2006  . HYPERTENSION 07/20/2006  . CELLULITIS 07/20/2006      Brown Human, SPT 06/17/2015  12:54 PM  [facility] [address] [place], [place], [address] Phone: [phone]   Fax:  [phone]  Name:  ILLYANA SCHORSCH MRN: [id number] Date of Birth: 02-16-1966

## 2015-06-23 ENCOUNTER — Ambulatory Visit: Payer: PRIVATE HEALTH INSURANCE | Admitting: Physical Therapy

## 2015-06-25 ENCOUNTER — Ambulatory Visit: Payer: PRIVATE HEALTH INSURANCE | Admitting: Physical Therapy

## 2015-06-25 DIAGNOSIS — M542 Cervicalgia: Secondary | ICD-10-CM

## 2015-06-25 DIAGNOSIS — M25519 Pain in unspecified shoulder: Secondary | ICD-10-CM

## 2015-06-25 DIAGNOSIS — R293 Abnormal posture: Secondary | ICD-10-CM

## 2015-06-25 DIAGNOSIS — R29898 Other symptoms and signs involving the musculoskeletal system: Secondary | ICD-10-CM

## 2015-06-25 NOTE — Therapy (Addendum)
[facility] Outpatient Rehabilitation [facility] [address] [place], [place], [address] Phone: [phone]   Fax:  [phone]  Physical Therapy Treatment  Patient Details  Name: Kayla Ortiz MRN: [id number] Date of Birth: May 24, 1966 Referring Provider: Julio Sicks, MD  Encounter Date: 06/25/2015      PT End of Session - 06/25/15 0800    Visit Number 2   Number of Visits 8   Date for PT Re-Evaluation 07/15/15   Authorization Type [employer]Ed[employer]   PT Start Time 0800   PT Stop Time 0859   PT Time Calculation (min) 59 min   Activity Tolerance Patient tolerated treatment well   Behavior During Therapy [facility] for tasks assessed/performed      Past Medical History  Diagnosis Date  . Hypertension   . Hypercholesteremia   . MRSA (methicillin resistant staph aureus) culture positive     Past Surgical History  Procedure Laterality Date  . Cardaic ablasion      There were no vitals filed for this visit.  Visit Diagnosis:  Neck and shoulder pain  Muscle pain, cervical  Posture abnormality  Decreased ROM of neck      Subjective Assessment - 06/25/15 0820    Subjective Pt is concerned about work station and pain at work   Limitations Sitting   How long can you sit comfortably? 2 hours with head down    How long can you walk comfortably? 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; work desk job   Pain Score 2   7/10 at worst and need to leave work   Pain Location Neck   Pain Orientation Mid   Pain Descriptors / Indicators Shooting;Burning;Tightness   Pain Type Chronic pain   Pain Onset 1 to 4 weeks ago   Pain Frequency Constant   Aggravating Factors  sleeeping, siitting at desk for work            [facility] PT Assessment - 06/25/15 0846    AROM   Cervical - Right Rotation 60   Cervical - Left Rotation 62                     [facility] Adult PT Treatment/Exercise - 06/25/15 0821    Self-Care   Self-Care Posture   Posture work station, sitting and standing    Modalities   Modalities Moist Heat   Moist Heat Therapy   Number Minutes Moist Heat 15 Minutes   Moist Heat Location Cervical  and upper back   Manual Therapy   Manual Therapy Soft tissue mobilization   Soft tissue mobilization Pt with bil upper trap,levator and cervical paraspinals  gentle due to pt tissue irritability.    Neck Exercises: Stretches   Upper Trapezius Stretch 2 reps  bil   Upper Trapezius Stretch Limitations limited by pain   Levator Stretch 2 reps  bil   Levator Stretch Limitations VC limited by pain   Other Neck Stretches neck retraction x 5    Other Neck Stretches scapular retraction x 10 bilaterally          Trigger Point Dry Needling - 06/25/15 0825    Consent Given? Yes   Education Handout Provided Yes   Muscles Treated Upper Body Upper trapezius;Levator scapulae  erector spinae c-2 c4 bil marked response   Upper Trapezius Response Twitch reponse elicited;Palpable increased muscle length  bil   Levator Scapulae Response Twitch response elicited;Palpable increased muscle length  bil  marked response              PT Education - 06/25/15 0816    Education provided Yes   Education Details Pt educated on posture, Initial stretches and education on Trigger point dry needling discussion of work station set up.   Person(s) Educated Patient   Methods Explanation;Demonstration;Tactile cues;Verbal cues;Handout   Comprehension Returned demonstration;Verbalized understanding          PT Short Term Goals - 06/25/15 0800    PT SHORT TERM GOAL #1   Title Pt will be independent with HEP (07/01/2015)   Time 2   Period Weeks   Status On-going   PT SHORT TERM GOAL #2   Title Pt will demonstrate correct sitting posture to prevent further injury (07/01/2015)   Time 2   Period Weeks   Status On-going           PT Long Term Goals - 06/25/15 0801    PT LONG TERM GOAL #1   Title Pt  will stand >/= 30 minutes without R UE tingling and without an increase in pain in order to return to exercise program (07/15/2015)   Time 4   Period Weeks   Status On-going   PT LONG TERM GOAL #2   Title Pt will sit >/= 4 hours without an increase in symptoms with intermittent breaks in order to meet work requirements (07/15/2015)   Time 4   Period Weeks   Status On-going   PT LONG TERM GOAL #3   Title Pt will improve cervical rotation ROM bilaterally to 0-70 degrees without an increase in 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 Pt given handout on work station set up and suggestion for varus standing work desk for  job to decreased sitting and pt desk work which exacerbates forward head and  increasing pain with cervial radiculopathy.  Pt  was closely monitored for trigger point dry needling.  Pt  with sensitivity and marked response to trigger point dry needling .  Will assess benefit next visit and given handouts for aftercare and precautions.   as well as neck stretches.   Pt with increased ROM post trigger point dry needling. but pain levele remained the same.    Pt will benefit from skilled therapeutic intervention in order to improve on the following deficits Pain;Postural dysfunction;Increased muscle spasms;Decreased range of motion;Decreased strength;Increased fascial restricitons   Rehab Potential Good   PT Frequency 2x / week   PT Duration 4 weeks   PT Treatment/Interventions Cryotherapy;Manual techniques;Moist Heat;Traction;Patient/family education;Ultrasound;Electrical Stimulation;Therapeutic  exercise;Functional mobility training   PT Next Visit Plan assess dry needling. deep neck flexor strength, and scapular strength,  May use traction if DN not beneftcial   PT Home Exercise Plan Initiall posture. work station and Cabin crew.upper trap, levator, neck retraction and scapular squeezes   Consulted and Agree with Plan of Care Patient        Problem List Patient Active Problem List  Diagnosis Date Noted  . DEPRESSION 02/05/2007  . SYMPTOM, RASH, OTH NONSPECIFIC SKIN ERUPTION 12/06/2006  . SHOULDER PAIN, RIGHT 08/30/2006  . MRSA INFECTION 07/20/2006  . HERPES SIMPLEX, UNCOMPLICATED 07/20/2006  . HYPERTENSION 07/20/2006  . CELLULITIS 07/20/2006   Garen Lah, PT 06/25/2015 2:26 PM Phone: [phone] Fax: [phone]  [facility] [address] [place], [place], [address] Phone: [phone]   Fax:  [phone]  Name: KANESHA CADLE MRN: [id number] Date of Birth: 1965/07/18

## 2015-06-25 NOTE — Patient Instructions (Addendum)
Trigger Point Dry Needling  . What is Trigger Point Dry Needling (DN)? o DN is a physical therapy technique used to treat muscle pain and dysfunction. Specifically, DN helps deactivate muscle trigger points (muscle knots).  o A thin filiform needle is used to penetrate the skin and stimulate the underlying trigger point. The goal is for a local twitch response (LTR) to occur and for the trigger point to relax. No medication of any kind is injected during the procedure.   . What Does Trigger Point Dry Needling Feel Like?  o The procedure feels different for each individual patient. Some patients report that they do not actually feel the needle enter the skin and overall the process is not painful. Very mild bleeding may occur. However, many patients feel a deep cramping in the muscle in which the needle was inserted. This is the local twitch response.   Marland Kitchen How Will I feel after the treatment? o Soreness is normal, and the onset of soreness may not occur for a few hours. Typically this soreness does not last longer than two days.  o Bruising is uncommon, however; ice can be used to decrease any possible bruising.  o In rare cases feeling tired or nauseous after the treatment is normal. In addition, your symptoms may get worse before they get better, this period will typically not last longer than 24 hours.   . What Can I do After My Treatment? o Increase your hydration by drinking more water for the next 24 hours. o You may place ice or heat on the areas treated that have become sore, however, do not use heat on inflamed or bruised areas. Heat often brings more relief post needling. o You can continue your regular activities, but vigorous activity is not recommended initially after the treatment for 24 hours. o DN is best combined with other physical therapy such as strengthening, stretching, and other therapies.   Levator Stretch   Grasp seat or sit on hand on side to be stretched. Turn head  toward other side and look down. Use hand on head to gently stretch neck in that position. Hold _30___ seconds. Repeat on other side. Hold onto chair on side you want to stretch as shown in clinic. Armpit exercise Repeat _2-3___ times. Do _2-3___ sessions per day.  http://gt2.exer.us/30   Copyright  VHI. All rights reserved.  Side-Bending   One hand on opposite side of head, pull head to side as far as is comfortable. Stop if there is pain. Hold 30____ seconds. Repeat with other hand to other side. Repeat _2-3___ times. Do _2-3___ sessions per day. Do not hold onto head . Hold onto chair as shown in clinic   Copyright  VHI. All rights reserved.  Scapular Retraction (Standing)   With arms at sides, pinch shoulder blades together. Repeat __10__ times per set. Do _1___ sets per session. Do __1-2__ sessions per day.  http://orth.exer.us/944   Copyright  VHI. All rights reserved.  Chin Protraction / Retraction   Slide head forward keeping chin level. Slide head back, pulling chin in. Hold each position 5___ seconds. Repeat __10_ times. Do _2__ sessions per day.  Copyright  VHI. All rights reserved.  Posture Tips DO: - stand tall and erect - keep chin tucked in - keep head and shoulders in alignment - check posture regularly in mirror or large window - pull head back against headrest in car seat;  Change your position often.  Sit with lumbar support. DON'T: - slouch or  slump while watching TV or reading - sit, stand or lie in one position  for too long;  Sitting is especially hard on the spine so if you sit at a desk/use the computer, then stand up often!   Copyright  VHI. All rights reserved.  Posture - Standing   Good posture is important. Avoid slouching and forward head thrust. Maintain curve in low back and align ears over shoul- ders, hips over ankles.  Pull your belly button in toward your back bone. Even weight in ball of toes and heels.  Lift ribs up with chin down.   Not military.  Not titantic   Copyright  VHI. All rights reserved.  Posture - Sitting   Sit upright, head facing forward. Try using a roll to support lower back. Keep shoulders relaxed, and avoid rounded back. Keep hips level with knees. Avoid crossing legs for long periods. Do not perch on edge of seat.  Look at Work station hand out given  During session to set up work station.  Kayla Ortiz, PT 06/25/2015 8:16 AM Phone: [phone] Fax: [phone]    Copyright  VHI. All rights reserved.

## 2015-06-30 ENCOUNTER — Ambulatory Visit: Payer: PRIVATE HEALTH INSURANCE | Admitting: Physical Therapy

## 2015-06-30 DIAGNOSIS — M542 Cervicalgia: Secondary | ICD-10-CM

## 2015-06-30 DIAGNOSIS — M25519 Pain in unspecified shoulder: Secondary | ICD-10-CM

## 2015-06-30 DIAGNOSIS — R29898 Other symptoms and signs involving the musculoskeletal system: Secondary | ICD-10-CM

## 2015-06-30 DIAGNOSIS — R293 Abnormal posture: Secondary | ICD-10-CM

## 2015-06-30 NOTE — Patient Instructions (Addendum)
Make sure you are in good postion in supine with knees bent and towel roll under neck.    Over Head Pull: Narrow Grip       On back, knees bent, feet flat, band across thighs, elbows straight but relaxed. Pull hands apart (start). Keeping elbows straight, bring arms up and over head, hands toward floor. Keep pull steady on band. Hold momentarily. Return slowly, keeping pull steady, back to start. Repeat _10__ times. Band color __yellow____   Side Pull: Double Arm   On back, knees bent, feet flat. Arms perpendicular to body, shoulder level, elbows straight but relaxed. Pull arms out to sides, elbows straight. Resistance band comes across collarbones, hands toward floor. Hold momentarily. Slowly return to starting position. Repeat _10__ times. Band color _yellow____   Sash   On back, knees bent, feet flat, left hand on left hip, right hand above left. Pull right arm DIAGONALLY (hip to shoulder) across chest. Bring right arm along head toward floor. Hold momentarily. Slowly return to starting position. Repeat _10__ times. Do with left arm. Band color _yellow_____   Shoulder Rotation: Double Arm   On back, knees bent, feet flat, elbows tucked at sides, bent 90, hands palms up. Pull hands apart and down toward floor, keeping elbows near sides. Hold momentarily. Slowly return to starting position. Repeat _10__ times. Band color _yellow_____   You can progress by changing band color and increase repetitions to 3 x 10 reps.  Garen Lah, PT 06/30/2015 7:47 AM Phone: [phone] Fax: [phone]

## 2015-06-30 NOTE — Therapy (Signed)
[facility] [address] [place], [place], [address] Phone: [phone]   Fax:  [phone]  Physical Therapy Treatment  Patient Details  Name: Kayla Ortiz MRN: [id number] Date of Birth: 1965-08-10 Referring Provider: Julio Sicks, MD  Encounter Date: 06/30/2015      PT End of Session - 06/30/15 0746    Visit Number 3   Number of Visits 8   Date for PT Re-Evaluation 07/15/15   Authorization Type MEdcost   PT Start Time 0745   PT Stop Time 0843   PT Time Calculation (min) 58 min   Activity Tolerance Patient tolerated treatment well   Behavior During Therapy [facility] for tasks assessed/performed      Past Medical History  Diagnosis Date  . Hypertension   . Hypercholesteremia   . MRSA (methicillin resistant staph aureus) culture positive     Past Surgical History  Procedure Laterality Date  . Cardaic ablasion      There were no vitals filed for this visit.  Visit Diagnosis:  Neck and shoulder pain  Muscle pain, cervical  Posture abnormality  Decreased ROM of neck      Subjective Assessment - 06/30/15 0748    Subjective Dry needling was painful initially but after I got into whirlpool, I felt no pain. I felt that dry needling was helpful but I want to try the traction today as Dr. Dutch Quint suggested.   Pertinent History hypertension   Currently in Pain? Yes   Pain Score 3   7/10 after working  at the end of the day   Pain Location Neck   Pain Orientation Mid;Left;Right  upper thoracici   Pain Descriptors / Indicators Shooting;Tightness;Burning   Pain Radiating Towards Left and Right arm fingers: ri shoulder pain   Pain Onset More than a month ago   Pain Frequency Intermittent            [facility] PT Assessment - 06/30/15 0818    AROM   Cervical Flexion 68   Cervical Extension 55  leads with chin; poor anterior cervical muscular control   Cervical - Right Side Bend 37   Cervical - Left Side Bend 44   Cervical - Right Rotation 60   Cervical - Left Rotation 65                     [facility] Adult PT Treatment/Exercise - 06/30/15 0801    Neck Exercises: Supine   Other Supine Exercise supine scapular stabilizers flex, abd, diagonals bil, and ER with yellow t band x 10 each with VC and TC   Moist Heat Therapy   Number Minutes Moist Heat 10 Minutes   Moist Heat Location Cervical  and upper back   Traction   Type of Traction Cervical   Min (lbs) 10   Max (lbs) 15   Hold Time 60 sec   Rest Time 20 sec   Time 15   Manual Therapy   Manual Therapy Soft tissue mobilization   Soft tissue mobilization Pt with bil upper trap,levator and cervical paraspinals  gentle due to pt tissue irritability. and use of IASTYM tool  and suboccipital release in prone.                PT Education - 06/30/15 0759    Education provided Yes   Education Details Pt educated on supine scap stabilizers and benefts of traction/dry needling   Person(s) Educated Patient   Methods Explanation;Demonstration;Tactile cues;Verbal cues;Handout  Comprehension Verbalized understanding;Returned demonstration          PT Short Term Goals - 06/30/15 0833    PT SHORT TERM GOAL #1   Title Pt will be independent with HEP (07/01/2015)   Time 2   Period Weeks   Status On-going   PT SHORT TERM GOAL #2   Title Pt will demonstrate correct sitting posture to prevent further injury (07/01/2015)   Baseline currently trying to accomodate at work station at work   Time 2   Period Weeks   Status On-going           PT Long Term Goals - 06/25/15 0801    PT LONG TERM GOAL #1   Title Pt will stand >/= 30 minutes without R UE tingling and without an increase in pain in order to return to exercise program (07/15/2015)   Time 4   Period Weeks   Status On-going   PT LONG TERM GOAL #2   Title Pt will sit >/= 4 hours without an increase in symptoms with intermittent breaks in order to meet work requirements  (07/15/2015)   Time 4   Period Weeks   Status On-going   PT LONG TERM GOAL #3   Title Pt will improve cervical rotation ROM bilaterally to 0-70 degrees without an increase in pain 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  Pt noted improved pain in neck but still with radiating pain into right shoulder.  Pt to try cervical traction for 2nd time to assess benefit.  Dry needling seems to have improved muscle tightness in upper traps and levator bilaerally. .  Pt  given supine scapular stabilizers for home strengthenng.  Will continue to assess for modalities and pain relief of pain and paresthesias.  .Pt with hypersensitive to gentle lifht touch in mid scapular and cervical region especially C3 C4 right area.   Pt to try cervical traction  3 times to assess benefit .  AROM cervical 68 flex, 55 ext without pulling on anterior muscle, right side bend 37, left 55, rotation to left 65 and to right 60.     Pt will benefit from skilled therapeutic intervention in order to improve on the following deficits Pain;Postural dysfunction;Increased muscle spasms;Decreased range of motion;Decreased strength;Increased fascial restricitons   Rehab Potential Good   PT Frequency 2x / week   PT Duration 4 weeks   PT Treatment/Interventions Cryotherapy;Manual techniques;Moist Heat;Traction;Patient/family education;Ultrasound;Electrical Stimulation;Therapeutic exercise;Functional mobility training   PT Next Visit Plan assess traction benefit and try one more time, deep neck flexor stretch, review exericise and progress to red t band.    PT Home Exercise Plan Initiall posture.  work station and Cabin crew.upper trap, levator, neck retraction and scapular squeezes and supine scap stabilizers        Problem List Patient Active Problem List   Diagnosis Date Noted  . DEPRESSION 02/05/2007  . SYMPTOM, RASH, OTH NONSPECIFIC SKIN ERUPTION 12/06/2006  . SHOULDER PAIN, RIGHT 08/30/2006  . MRSA INFECTION 07/20/2006  . HERPES SIMPLEX,  UNCOMPLICATED 07/20/2006  . HYPERTENSION 07/20/2006  . CELLULITIS 07/20/2006   Garen Lah, PT 06/30/2015 8:37 AM Phone: [phone] Fax: [phone] [facility] [address] [place], [place], [address] Phone: [phone]   Fax:  [phone]  Name: Kayla Ortiz MRN: [id number] Date of Birth: [birth year]

## 2015-07-02 ENCOUNTER — Ambulatory Visit: Payer: Self-pay | Attending: Neurosurgery | Admitting: Physical Therapy

## 2015-07-02 DIAGNOSIS — M542 Cervicalgia: Secondary | ICD-10-CM | POA: Insufficient documentation

## 2015-07-02 DIAGNOSIS — R29898 Other symptoms and signs involving the musculoskeletal system: Secondary | ICD-10-CM | POA: Insufficient documentation

## 2015-07-02 DIAGNOSIS — M25519 Pain in unspecified shoulder: Secondary | ICD-10-CM | POA: Insufficient documentation

## 2015-07-02 DIAGNOSIS — R293 Abnormal posture: Secondary | ICD-10-CM | POA: Insufficient documentation

## 2015-07-02 NOTE — Therapy (Signed)
[facility] [address] [place], [place], [address] Phone: [phone]   Fax:  [phone]  Physical Therapy Treatment  Patient Details  Name: MADALIN HUGHART MRN: [id number] Date of Birth: Jan 25, 1966 Referring Provider: Julio Sicks, MD  Encounter Date: 07/02/2015      PT End of Session - 07/02/15 1326    Visit Number 4   Number of Visits 8   PT Start Time 0732   PT Stop Time 0805   PT Time Calculation (min) 33 min   Activity Tolerance Patient tolerated treatment well;No increased pain   Behavior During Therapy [facility] for tasks assessed/performed      Past Medical History  Diagnosis Date  . Hypertension   . Hypercholesteremia   . MRSA (methicillin resistant staph aureus) culture positive     Past Surgical History  Procedure Laterality Date  . Cardaic ablasion      There were no vitals filed for this visit.  Visit Diagnosis:  Neck and shoulder pain  Muscle pain, cervical  Posture abnormality  Decreased ROM of neck      Subjective Assessment - 07/02/15 0748    Subjective Traction helped a few hours.  I want to try that again.     Currently in Pain? Yes   Pain Score 3    Pain Location Neck   Pain Orientation Right;Left;Mid                         [facility] Adult PT Treatment/Exercise - 07/02/15 0749    Neck Exercises: Seated   Other Seated Exercise deep neck flexor 5 X 10 seconds.  good technique observed   Traction   Type of Traction Cervical   Min (lbs) 10   Max (lbs) 16   Hold Time 60   Rest Time 20   Time 16                  PT Short Term Goals - 07/02/15 1327    PT SHORT TERM GOAL #1   Title Pt will be independent with HEP (07/01/2015)   Baseline minor cues   Time 2   Period Weeks   Status On-going   PT SHORT TERM GOAL #2   Title Pt will demonstrate correct sitting posture to prevent further injury (07/01/2015)   Time 2   Period Weeks   Status On-going            PT Long Term Goals - 06/25/15 0801    PT LONG TERM GOAL #1   Title Pt will stand >/= 30 minutes without R UE tingling and without an increase in pain in order to return to exercise program (07/15/2015)   Time 4   Period Weeks   Status On-going   PT LONG TERM GOAL #2   Title Pt will sit >/= 4 hours without an increase in symptoms with intermittent breaks in order to meet work requirements (07/15/2015)   Time 4   Period Weeks   Status On-going   PT LONG TERM GOAL #3   Title Pt will improve cervical rotation ROM bilaterally to 0-70 degrees without an increase 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  No new orders for that yet in [facility].  Pain decreases temporarily with traction.     PT Next Visit Plan traction, deep neck flexor stretch, review exericise and progress to red t band.    PT Home Exercise Plan continue   Consulted and Agree with Plan of Care Patient        Problem List Patient Active Problem List   Diagnosis Date Noted  . DEPRESSION 02/05/2007  . SYMPTOM, RASH, OTH NONSPECIFIC SKIN ERUPTION 12/06/2006  . SHOULDER PAIN, RIGHT 08/30/2006  . MRSA INFECTION 07/20/2006  . HERPES SIMPLEX, UNCOMPLICATED 07/20/2006  . HYPERTENSION 07/20/2006  . CELLULITIS 07/20/2006    HARRIS,KAREN 07/02/2015, 1:28 PM  [facility]: [facility] [address] [place], [place], [address] Phone: [phone]   Fax:  [phone]  Name: ANWAR CRILL MRN: [id number] Date of Birth: 03/09/1966    Liz Beach, PTA 07/02/2015 1:28 PM Phone: [phone] Fax:  [phone]

## 2015-07-07 ENCOUNTER — Ambulatory Visit: Payer: Worker's Compensation | Admitting: Physical Therapy

## 2015-07-07 DIAGNOSIS — R293 Abnormal posture: Secondary | ICD-10-CM

## 2015-07-07 DIAGNOSIS — R29898 Other symptoms and signs involving the musculoskeletal system: Secondary | ICD-10-CM

## 2015-07-07 DIAGNOSIS — M542 Cervicalgia: Secondary | ICD-10-CM

## 2015-07-07 DIAGNOSIS — M25519 Pain in unspecified shoulder: Secondary | ICD-10-CM

## 2015-07-07 NOTE — Patient Instructions (Signed)
Pt was given Decompression Exericises on sheet and performed in clinic  Cranio cradle on [facility] for neck decompression.  Best cervical traction unit is [place] unit.   Decompression position for 3-5 min to 15 min at home,  May use end of couch and build up with pillows for added distraction of lumbar area.  1 inch from floor using pelvic tilt.  Shoulder press 2-3 sec 3-5 x Head press 2-3 sec 3-5 x Leg press hold 2-3 seconds and repeat 3-5 x each side Leg lengthener 2-3 second hold and repeat 3-5 x each  Side  Garen Lah, PT 07/07/2015 8:25 AM Phone: [phone] Fax: [phone]

## 2015-07-07 NOTE — Therapy (Signed)
[facility] Outpatient Rehabilitation [facility] [address] [place], [place], [address] Phone: [phone]   Fax:  [phone]  Physical Therapy Treatment  Patient Details  Name: Kayla Ortiz MRN: [id number] Date of Birth: January 29, 1966 Referring Provider: Julio Sicks, MD  Encounter Date: 07/07/2015      PT End of Session - 07/07/15 0803    Visit Number 5   Number of Visits 8   Date for PT Re-Evaluation 07/15/15   Authorization Type MEdcost   PT Start Time 0800   PT Stop Time 0858   PT Time Calculation (min) 58 min   Activity Tolerance Patient tolerated treatment well   Behavior During Therapy [facility] for tasks assessed/performed      Past Medical History  Diagnosis Date  . Hypertension   . Hypercholesteremia   . MRSA (methicillin resistant staph aureus) culture positive     Past Surgical History  Procedure Laterality Date  . Cardaic ablasion      There were no vitals filed for this visit.  Visit Diagnosis:  Neck and shoulder pain  Muscle pain, cervical  Posture abnormality  Decreased ROM of neck      Subjective Assessment - 07/07/15 0805    Subjective Traction helps for the rest of the day.  Pain was back the following day.  I have been swimming and doing the whirlpool.   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 Yes   Pain Score 2    Pain Location Neck   Pain Orientation Right;Left;Mid   Pain Descriptors / Indicators Tightness;Shooting;Burning   Pain Onset More than a month ago   Pain Frequency Intermittent            [facility] PT Assessment - 07/07/15 0835    Strength   Right Hand Grip (lbs) 40, 50 52 #   Left Hand Grip (lbs) 50. 48, 49 #                     [facility] Adult PT Treatment/Exercise - 07/07/15 0837    Self-Care   Self-Care Posture;Other Self-Care Comments   Posture continueing to monitor and change work station , use of bedside table for standing    Other Self-Care Comments  use of end of couch to self traction lumbar spine for continued spinal distraction using pelvic tilt.    Neck Exercises: Supine   Neck Retraction 10 reps   Neck Retraction Limitations VC for chin tuck to chest. pt tends to lead with forward head   Other Supine Exercise decompression ex supine with knee flex position and shoulder press, head press, leg lengthener and leg press 2-3 sec each  bil and 6 x each sequesnce   Traction   Type of Traction Cervical   Min (lbs) 12   Max (lbs) 22   Hold Time 60   Rest Time 20   Time 17   Manual Therapy   Manual Therapy Soft tissue mobilization   Soft tissue mobilization Pt with bil upper trap,levator and cervical paraspinals  gentle due to pt tissue irritability. and suboccipital release in supine   Neck Exercises: Stretches   Upper Trapezius Stretch 2 reps  bil   Levator Stretch 2 reps  bil                PT Education - 07/07/15 0825  Education provided Yes   Education Details Decompression Exericises, home tools. cervical traction units   Person(s) Educated Patient   Methods Explanation;Demonstration;Tactile cues;Verbal cues   Comprehension Verbalized understanding;Returned demonstration          PT Short Term Goals - 07/07/15 0842    PT SHORT TERM GOAL #1   Title Pt will be independent with HEP (07/01/2015)   Baseline decompression given today   Time 2   Period Weeks   PT SHORT TERM GOAL #2   Title Pt will demonstrate correct sitting posture to prevent further injury (07/01/2015)   Baseline currently trying to accomodate at work station at [employer] pt tries to stand more but needs a standing desk   Time 2   Period Weeks   Status Achieved           PT Long Term Goals - 06/25/15 0801    PT LONG TERM GOAL #1   Title Pt will stand >/= 30 minutes without R UE tingling and without an increase in pain in  order to return to exercise program (07/15/2015)   Time 4   Period Weeks   Status On-going   PT LONG TERM GOAL #2   Title Pt will sit >/= 4 hours without an increase in symptoms with intermittent breaks in order to meet work requirements (07/15/2015)   Time 4   Period Weeks   Status On-going   PT LONG TERM GOAL #3   Title Pt will improve cervical rotation ROM bilaterally to 0-70 degrees without an increase in 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  Pt seems to benefit from cervical traction and would benefit from a home unit and get an order from MD for home use. Pt would benefit from an order for cervical traction if pt continues benefit as well as ergonomix changes to her desk in order to continue working at computer during full work day. Dry needling is not as beneficial and  pt will continue with traction for 3 remainng visits  and then return to MD.  Pt instructed in decompression exerciise today .  Pt also may benefti from a cranio cradle from [facility] for self suboccipital relase in supine.  Pt is compliant with exericises but still has tingling into both hands.  Grip strength is about 45 to 5 degrees in bil hands. STG # 2 achieved today and STG for independence of exericise   Pt will benefit from skilled therapeutic intervention in order to improve on the following deficits Pain;Postural dysfunction;Increased muscle spasms;Decreased range of motion;Decreased strength;Increased fascial restricitons   Rehab Potential Good   PT Frequency 2x / week   PT Duration 4 weeks   PT  Treatment/Interventions Cryotherapy;Manual techniques;Moist Heat;Traction;Patient/family education;Ultrasound;Electrical Stimulation;Therapeutic exercise;Functional mobility training   PT Next Visit Plan traction, deep neck flexor stretch, review exericise and progress to red t band.    PT Home Exercise Plan continue wtih HEP and  Problem List Patient Active Problem List   Diagnosis Date Noted  . DEPRESSION 02/05/2007  . SYMPTOM, RASH, OTH NONSPECIFIC SKIN ERUPTION 12/06/2006  . SHOULDER PAIN, RIGHT 08/30/2006  . MRSA INFECTION 07/20/2006  . HERPES SIMPLEX, UNCOMPLICATED 07/20/2006  . HYPERTENSION 07/20/2006  . CELLULITIS 07/20/2006    Garen Lah, PT 07/07/2015 12:27 PM Phone: [phone] Fax: [phone]  [facility] [address] [place], [place], [address] Phone: [phone]   Fax:  [phone]  Name: ZABDI MIS MRN: [id number] Date of Birth: 11/10/1965

## 2015-07-09 ENCOUNTER — Telehealth: Payer: Self-pay | Admitting: Physical Therapy

## 2015-07-09 ENCOUNTER — Ambulatory Visit: Payer: Self-pay | Admitting: Physical Therapy

## 2015-07-09 DIAGNOSIS — M542 Cervicalgia: Secondary | ICD-10-CM

## 2015-07-09 DIAGNOSIS — M25519 Pain in unspecified shoulder: Secondary | ICD-10-CM

## 2015-07-09 DIAGNOSIS — R293 Abnormal posture: Secondary | ICD-10-CM

## 2015-07-09 DIAGNOSIS — R29898 Other symptoms and signs involving the musculoskeletal system: Secondary | ICD-10-CM

## 2015-07-09 NOTE — Telephone Encounter (Signed)
Left message on voicemail  for her to use yellow band at work, red band at home with towel roll for neck.(  I consulted with PT Berenda Morale prior to leaving the phone message. ) If that does not help. Stop thew exercises and focus on using good posture at work.

## 2015-07-09 NOTE — Therapy (Addendum)
[place], [place], [address] Phone: [phone]   Fax:  [phone]  Physical Therapy Treatment/Discharge Note  Patient Details  Name: Kayla Ortiz MRN: [id number] Date of Birth: January 31, 1966 Referring Provider: Earnie Larsson, MD  Encounter Date: 07/09/2015      PT End of Session - 07/09/15 1049    Visit Number 6   Number of Visits 8   Date for PT Re-Evaluation 07/15/15   PT Start Time 0730   PT Stop Time 0810   PT Time Calculation (min) 40 min   Activity Tolerance Patient tolerated treatment well   Behavior During Therapy [facility] for tasks assessed/performed      Past Medical History  Diagnosis Date  . Hypertension   . Hypercholesteremia   . MRSA (methicillin resistant staph aureus) culture positive     Past Surgical History  Procedure Laterality Date  . Cardaic ablasion      There were no vitals filed for this visit.  Visit Diagnosis:  Neck and shoulder pain  Muscle pain, cervical  Posture abnormality  Decreased ROM of neck      Subjective Assessment - 07/09/15 0740    Subjective Traction slipped off her head  2 X last treatment.  Had sharp pains into deltiod 3 X yesterday( RT. New pain)  Numbness and tingling are intermittant in RT arm.  Pain and pressure is still increased by the end of her 8 hours. She takes her bands to work and exercises mid day.    Currently in Pain? Yes   Pain Score 2   mild   Pain Location --   Pain Orientation Right;Left;Mid   Pain Descriptors / Indicators Tingling;Numbness;Sharp   Pain Radiating Towards arms   Pain Frequency Intermittent   Aggravating Factors  long hours sitting   Pain Relieving Factors Traction.                          [place] Adult PT Treatment/Exercise - 07/09/15 0735    Neck Exercises: Supine   Neck Retraction Limitations deep neck flexion 5 X 5 seconds, cues   Other Supine Exercise supine scapular stabilization    neck roll used to help neck relax with this,,  red band   Traction   Type of Traction Cervical   Min (lbs) 10   Max (lbs) 22  headband used   Hold Time 60   Rest Time 20   Time 17  2 steps ramp up and down                  PT Short Term Goals - 07/07/15 0842    PT SHORT TERM GOAL #1   Title Pt will be independent with HEP (07/01/2015)   Baseline decompression given today   Time 2   Period Weeks   PT SHORT TERM GOAL #2   Title Pt will demonstrate correct sitting posture to prevent further injury (07/01/2015)   Baseline currently trying to accomodate at work station at [employer] pt tries to stand more but needs a standing desk   Time 2   Period Weeks   Status Achieved           PT Long Term Goals - 06/25/15 0801    PT LONG TERM GOAL #1   Title Pt will stand >/= 30 minutes without R UE tingling and without an increase in pain in order to return to exercise program (07/15/2015)  Time 4   Period Weeks   Status On-going   PT LONG TERM GOAL #2   Title Pt will sit >/= 4 hours without an increase in symptoms with intermittent breaks in order to meet work requirements (07/15/2015)   Time 4   Period Weeks   Status On-going   PT LONG TERM GOAL #3   Title Pt will improve cervical rotation ROM bilaterally to 0-70 degrees without an increase in 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  Trial of reaching change at office will probably help her new shoudler pain.  No pain increase today.  She said MD sent prescription for back pain 2 weeks ago,     PT Next Visit Plan traction, deep neck flexor  stretch, review exericise She may need a back eval if new script.    PT Home Exercise Plan use red bands   Consulted and Agree with Plan of Care Patient        Problem List Patient Active Problem List   Diagnosis Date Noted  . DEPRESSION 02/05/2007  . SYMPTOM, RASH, OTH NONSPECIFIC SKIN ERUPTION 12/06/2006  . SHOULDER PAIN, RIGHT 08/30/2006  . MRSA INFECTION 07/20/2006  . HERPES SIMPLEX, UNCOMPLICATED 23/76/2831  . HYPERTENSION 07/20/2006  . CELLULITIS 07/20/2006    HARRIS,KAREN 07/09/2015, 11:29 AM  [facility] [address] [place], [place], [address] Phone: [phone]   Fax:  [phone]  Name: Kayla Ortiz MRN: [id number] Date of Birth: Aug 18, 1965    Melvenia Needles, PTA 07/09/2015 11:29 AM Phone: [phone] Fax: [phone]  PHYSICAL THERAPY DISCHARGE SUMMARY  Visits from Start of Care: 6  Current functional level related to goals / functional outcomes: See above. Pt still with 2/10 pain level exacerbated by office /ergonomic set up.    Remaining deficits: Traction helps and Trigger point dry needling helps with pain but nothing long lasting.  Pt may need to return to MD for further work up Goals only partially met  And pain minimized but not eliminated   Education / Equipment: HEP , work Scientist, water quality, Biomedical scientist.  May benefit from a standing desk for continuing to work Plan: Patient agrees to discharge.  Patient goals were not met.                                                  not returning since the last visit.  ?????       Pt with short term gain , some relief from traction.  May benefit from home traction unit. Voncille Lo, PT 07/30/2015 2:40 PM Phone: [phone] Fax: [phone]

## 2015-07-14 ENCOUNTER — Ambulatory Visit: Payer: PRIVATE HEALTH INSURANCE

## 2016-03-12 IMAGING — CR DG CHEST 2V
2 series · 2 of 2 positions shown · non-contrast
Comparison: 07/05/2013; 05/19/2004

CLINICAL DATA: Tachycardia. Headache. Shortness of breath. History
of heart ablation.

EXAM:
CHEST  2 VIEW

[w chest pa]
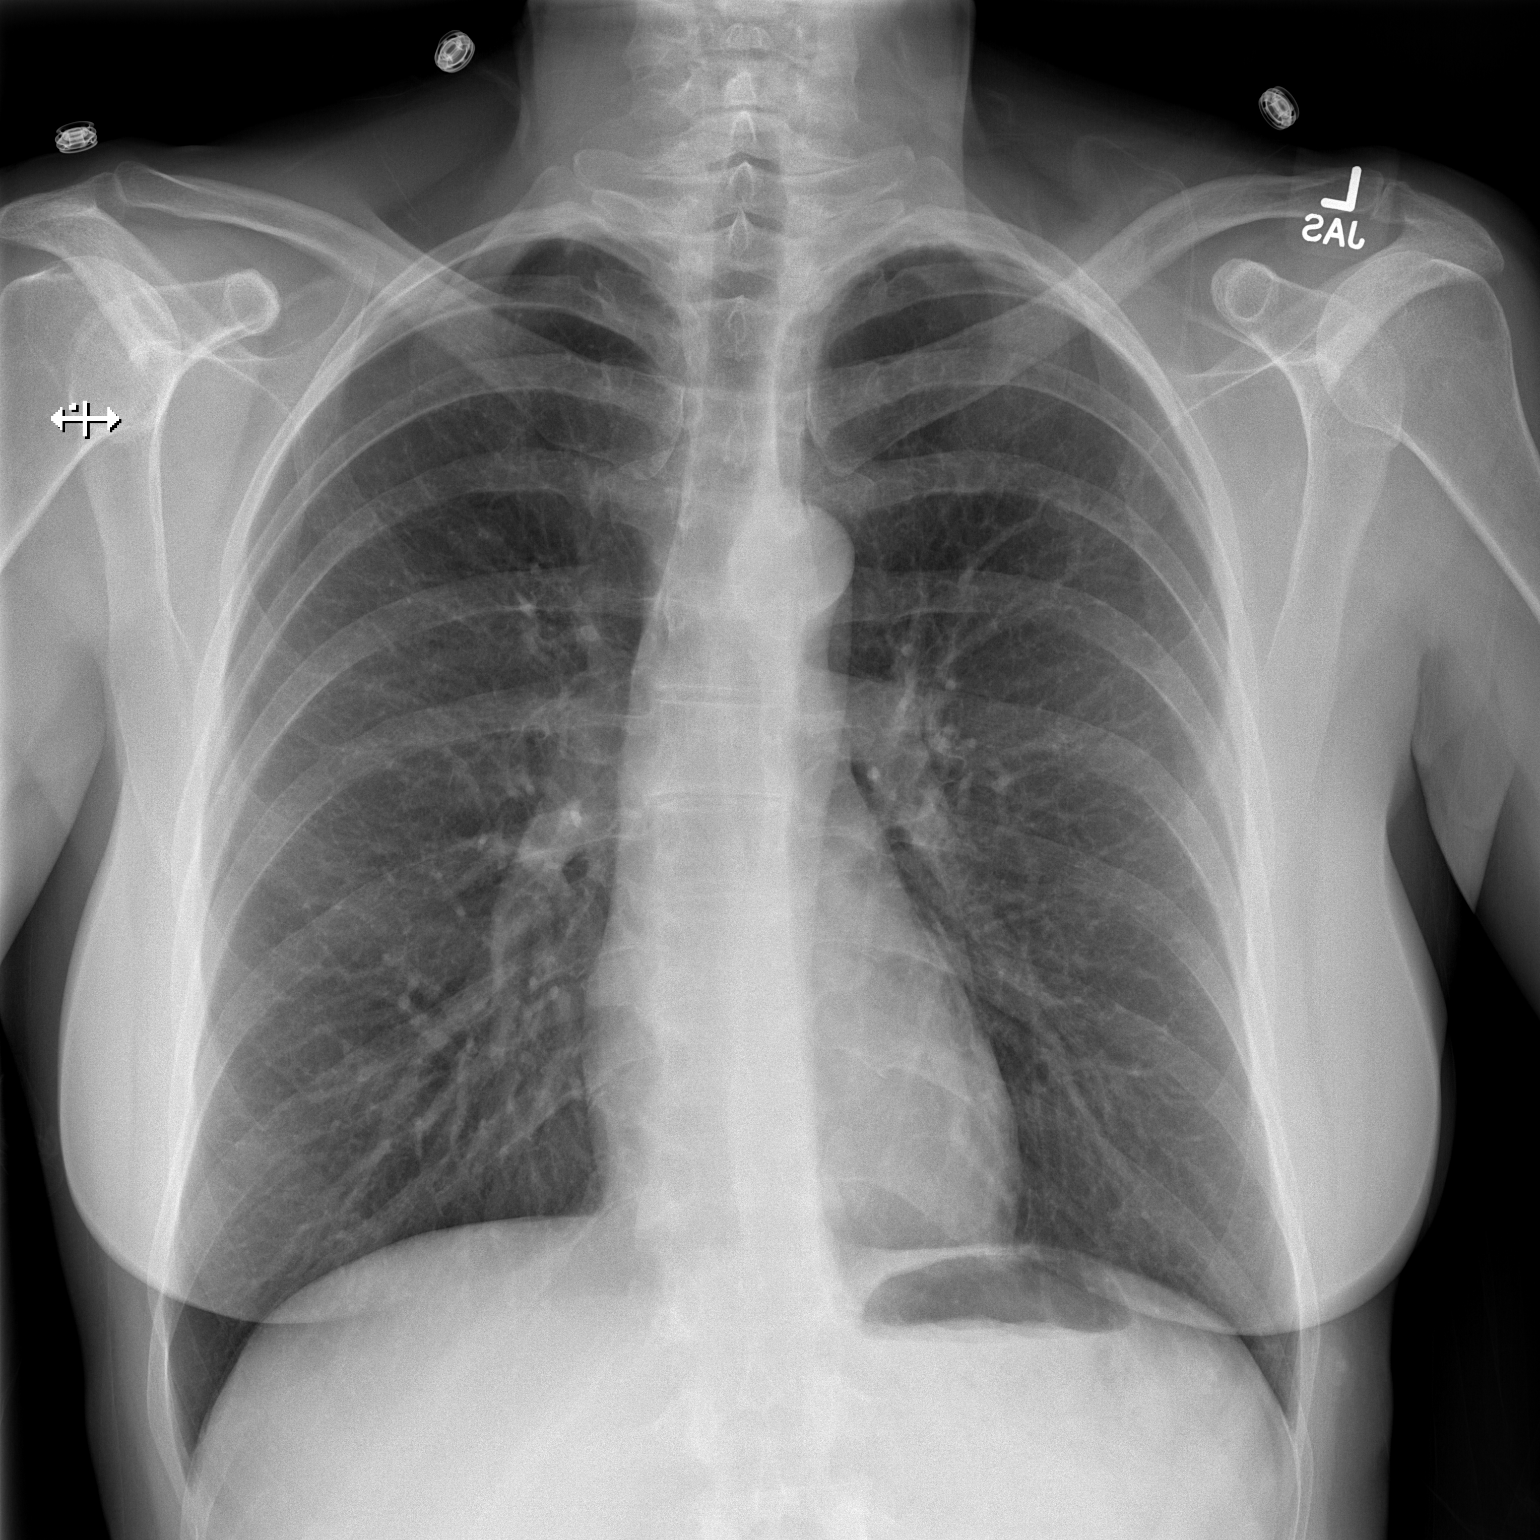

[w chest lat]
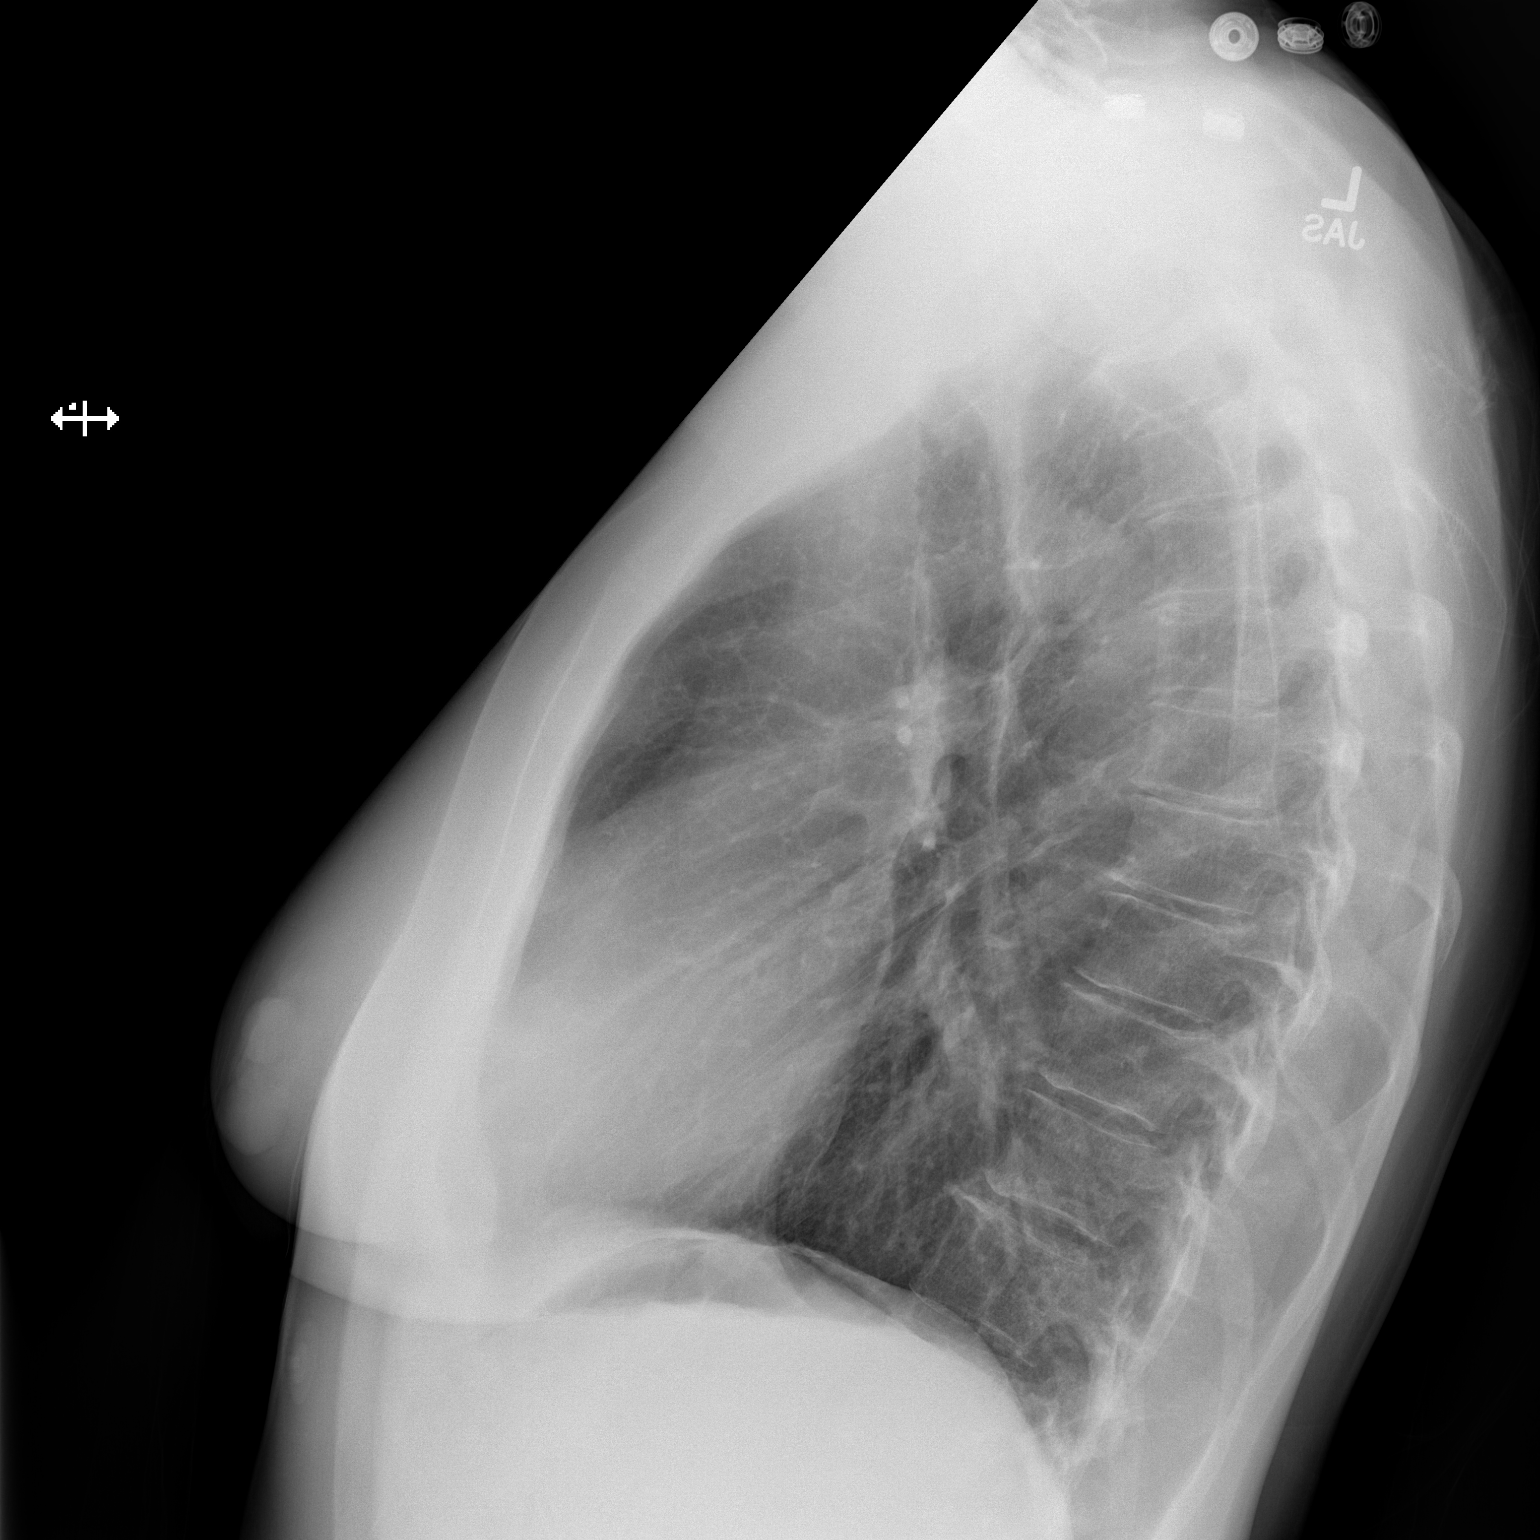

[2 of 2 positions shown; findings below may reference images not displayed]

FINDINGS: Unchanged cardiac silhouette and mediastinal contours. No focal
parenchymal opacities. No pleural effusion or pneumothorax. No
evidence of edema. No acute osseus abnormalities.
IMPRESSION: No acute cardiopulmonary disease.

## 2016-07-22 LAB — HM MAMMOGRAPHY

## 2016-07-22 LAB — HM PAP SMEAR

## 2016-07-29 ENCOUNTER — Ambulatory Visit (INDEPENDENT_AMBULATORY_CARE_PROVIDER_SITE_OTHER): Payer: BLUE CROSS/BLUE SHIELD | Admitting: Family Medicine

## 2016-07-29 ENCOUNTER — Encounter: Payer: Self-pay | Admitting: Family Medicine

## 2016-07-29 VITALS — BP 125/85 | HR 102 | Temp 97.9°F | Resp 20 | Ht 71.0 in | Wt 181.5 lb

## 2016-07-29 DIAGNOSIS — R400 Somnolence: Secondary | ICD-10-CM | POA: Diagnosis not present

## 2016-07-29 DIAGNOSIS — Z0000 Encounter for general adult medical examination without abnormal findings: Secondary | ICD-10-CM

## 2016-07-29 DIAGNOSIS — I10 Essential (primary) hypertension: Secondary | ICD-10-CM | POA: Diagnosis not present

## 2016-07-29 DIAGNOSIS — E7800 Pure hypercholesterolemia, unspecified: Secondary | ICD-10-CM | POA: Insufficient documentation

## 2016-07-29 DIAGNOSIS — Z22322 Carrier or suspected carrier of Methicillin resistant Staphylococcus aureus: Secondary | ICD-10-CM

## 2016-07-29 DIAGNOSIS — E875 Hyperkalemia: Secondary | ICD-10-CM

## 2016-07-29 DIAGNOSIS — R0683 Snoring: Secondary | ICD-10-CM | POA: Diagnosis not present

## 2016-07-29 DIAGNOSIS — F418 Other specified anxiety disorders: Secondary | ICD-10-CM | POA: Insufficient documentation

## 2016-07-29 LAB — BASIC METABOLIC PANEL WITH GFR
BUN: 19 mg/dL (ref 7–25)
CO2: 30 mmol/L (ref 20–31)
Calcium: 9.7 mg/dL (ref 8.6–10.2)
Chloride: 102 mmol/L (ref 98–110)
Creat: 0.84 mg/dL (ref 0.50–1.10)
GFR, EST NON AFRICAN AMERICAN: 82 mL/min (ref >=60)
GLUCOSE: 103 mg/dL — AB (ref 65–99)
POTASSIUM: 5.2 mmol/L (ref 3.5–5.3)
Sodium: 141 mmol/L (ref 135–146)

## 2016-07-29 MED ORDER — HYDROCHLOROTHIAZIDE 12.5 MG PO TABS: 12.5000 mg | ORAL_TABLET | Freq: Every day | ORAL | 1 refills | Status: DC

## 2016-07-29 MED ORDER — DOXYCYCLINE MONOHYDRATE 100 MG PO CAPS: 100.0000 mg | ORAL_CAPSULE | Freq: Every day | ORAL | 1 refills | Status: DC | PRN

## 2016-07-29 MED ORDER — OMEPRAZOLE 40 MG PO CPDR: DELAYED_RELEASE_CAPSULE | ORAL | 3 refills | Status: DC

## 2016-07-29 NOTE — Patient Instructions (Signed)
Pleasure to meet you today.   We will collect labs to check potassium. Followup in June for BP and lipids (fasting appt for labs).  Physical in December.    Please help [place] help you:  We are honored you have chosen [facility] for your Primary Care home. Below you will find basic instructions that you may need to access in the future. Please help [place] help you by reading the instructions, which cover many of the frequent questions we experience.   Prescription refills and request:  -In order to allow more efficient response time, please call your pharmacy for all refills. They will forward the request electronically to [place]. This allows for the quickest possible response. Request left on a nurse line can take longer to refill, since these are checked as time allows between office patients and other phone calls.  - refill request can take up to 3-5 working days to complete.  - If request is sent electronically and request is appropiate, it is usually completed in 1-2 business days.  - all patients will need to be seen routinely for all chronic medical conditions requiring prescription medications (see follow-up below). If you are overdue for follow up on your condition, you will be asked to make an appointment and we will call in enough medication to cover you until your appointment (up to 30 days).  - all controlled substances will require a face to face visit to request/refill.  - if you desire your prescriptions to go through a new pharmacy, and have an active script at original pharmacy, you will need to call your pharmacy and have scripts transferred to new pharmacy. This is completed between the pharmacy locations and not by your provider.    Results: If any images or labs were ordered, it can take up to 1 week to get results depending on the test ordered and the lab/facility running and resulting the test. - Normal or stable results, which do not need further discussion, will be released  to your mychart immediately with attached note to you. A call will not be generated for normal results. Please make certain to sign up for mychart. If you have questions on how to activate your mychart you can call the front office.  - If your results need further discussion, our office will attempt to contact you via phone, and if unable to reach you after 2 attempts, we will release your abnormal result to your mychart with instructions.  - All results will be automatically released in mychart after 1 week.  - Your provider will provide you with explanation and instruction on all relevant material in your results. Please keep in mind, results and labs may appear confusing or abnormal to the untrained eye, but it does not mean they are actually abnormal for you personally. If you have any questions about your results that are not covered, or you desire more detailed explanation than what was provided, you should make an appointment with your provider to do so.   Our office handles many outgoing and incoming calls daily. If we have not contacted you within 1 week about your results, please check your mychart to see if there is a message first and if not, then contact our office.  In helping with this matter, you help decrease call volume, and therefore allow [place] to be able to respond to patients needs more efficiently.   Acute office visits (sick visit):  An acute visit is intended for a new problem  and are scheduled in shorter time slots to allow schedule openings for patients with new problems. This is the appropriate visit to discuss a new problem. In order to provide you with excellent quality medical care with proper time for you to explain your problem, have an exam and receive treatment with instructions, these appointments should be limited to one new problem per visit. If you experience a new problem, in which you desire to be addressed, please make an acute office visit, we save openings on the  schedule to accommodate you. Please do not save your new problem for any other type of visit, let us take care of it properly and quickly for you.   Follow up visits:  Depending on your condition(s) your provider will need to see you routinely in order to provide you with quality care and prescribe medication(s). Most chronic conditions (Example: hypertension, Diabetes, depression/anxiety... etc), require visits a couple times a year. Your provider will instruct you on proper follow up for your personal medical conditions and history. Please make certain to make follow up appointments for your condition as instructed. Failing to do so could result in lapse in your medication treatment/refills. If you request a refill, and are overdue to be seen on a condition, we will always provide you with a 30 day script (once) to allow you time to schedule.    Medicare wellness (well visit): - we have a wonderful Nurse Selena Batten(Kim), that will meet with you and provide you will yearly medicare wellness visits. These visits should occur yearly (can not be scheduled less than 1 calendar year apart) and cover preventive health, immunizations, advance directives and screenings you are entitled to yearly through your medicare benefits. Do not miss out on your entitled benefits, this is when medicare will pay for these benefits to be ordered for you.  These are strongly encouraged by your provider and is the appropriate type of visit to make certain you are up to date with all preventive health benefits. If you have not had your medicare wellness exam in the last 12 months, please make certain to schedule one by calling the office and schedule your medicare wellness with Selena BattenKim as soon as possible.   Yearly physical (well visit):  - Adults are recommended to be seen yearly for physicals. Check with your insurance and date of your last physical, most insurances require one calendar year between physicals. Physicals include all  preventive health topics, screenings, medical exam and labs that are appropriate for gender/age and history. You may have fasting labs needed at this visit. This is a well visit (not a sick visit), acute topics should not be covered during this visit.  - Pediatric patients are seen more frequently when they are younger. Your provider will advise you on well child visit timing that is appropriate for your their age. - This is not a medicare wellness visit. Medicare wellness exams do not have an exam portion to the visit. Some medicare companies allow for a physical, some do not allow a yearly physical. If your medicare allows a yearly physical you can schedule the medicare wellness with our nurse Selena BattenKim and have your physical with your provider after, on the same day. Please check with insurance for your full benefits.   Late Policy/No Shows:  - all new patients should arrive 15-30 minutes earlier than appointment to allow us time  to  obtain all personal demographics,  insurance information and for you to complete office paperwork. - All established  patients should arrive 10-15 minutes earlier than appointment time to update all information and be checked in .  - In our best efforts to run on time, if you are late for your appointment you will be asked to either reschedule or if able, we will work you back into the schedule. There will be a wait time to work you back in the schedule,  depending on availability.  - If you are unable to make it to your appointment as scheduled, please call 24 hours ahead of time to allow [place] to fill the time slot with someone else who needs to be seen. If you do not cancel your appointment ahead of time, you may be charged a no show fee.

## 2016-07-29 NOTE — Progress Notes (Signed)
Patient ID: Kayla Ortiz, female  DOB: 01/06/1966, 51 y.o.   MRN: [id number] Patient Care Team    Relationship Specialty Notifications Start End  Natalia Leatherwoodenee A Sidda Humm, DO PCP - General Family Medicine  07/29/16   Lauraine Rinnearey G Cottle Jr., MD Attending Physician Psychiatry  07/29/16    Comment: [facility]  Ginnie SmartJeffrey C Hatcher, MD Consulting Physician Infectious Diseases  07/29/16   [facility]    07/29/16     Subjective:  Kayla MurphyChristine H Fleig is a 51 y.o.  female present for new patient establishment. All past medical history, surgical history, allergies, family history, immunizations, medications and social history were obtained  in the electronic medical record today. All recent labs, ED visits and hospitalizations within the last year were reviewed.  Patient presents today to establish new primary care physician. She was a prior patient of [facility] (Dr. Richrd Soxaheri). She has complaints of needing a follow-up for elevated potassium, elevated cholesterol and concerns for sleep apnea. She brings recent labs with her today, otherwise no medical records were provided. Hyperkalemia: Patient reports an elevated potassium, levels brought in today by prior labs of 5.3. Her blood pressure medications were altered taking her off lisinopril, and starting her on HCTZ. Patient is concerned her blood potassium is still too high. She denies any chest pain, weakness or dizziness. Anxiety with depression: Patient is followed with psychiatry on a routine basis. She is prescribed Lamictal, brintellix, dexmethylphenidate. Hypertension/hyperlipidemia: Patient reports recent change in medications from lisinopril to HCTZ 12.5 mg daily. She denies In range blood pressures. She denies chest pain, shortness of breath, dizziness or lower extremity edema. 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 She states she had seen infectious disease in the past, and she is prescribed  doxycycline when necessary. She reports when she starts to notice pain, points to her right maxillary sinus, she starts doxycycline 100 mg daily for about 2 weeks. Snoring/daytime somnolence: Patient reports history of daytime somnolence that started a few months ago. Her husband states that her snoring has increased, and he also has noted times when she appears to lose her breath in the middle of the night. She has concerns of sleep apnea, states her father had sleep apnea. She also states that she had elevated CO2 level at her last physical in November with her prior primary care physician.  Patient has a significant medical history of depression with anxiety, and is prescribed Lamictal, Dexmethylphenidate and Brintellix under the care of Dr. Jennelle Humanottle.   Depression screen PHQ 2/9 07/29/2016  Decreased Interest 0  Down, Depressed, Hopeless 0  PHQ - 2 Score 0   Current Exercise Habits: Structured exercise class, Type of exercise: treadmill, Time (Minutes): 30, Frequency (Times/Week): 3, Weekly Exercise (Minutes/Week): 90, Intensity: Mild     There is no immunization history on file for this patient.   Past Medical History:  Diagnosis Date  . Depression with anxiety   . Dyspepsia   . Hypercholesteremia   . Hyperkalemia 2017   Very mild, felt to be from ACEi, resolved after med switched.   . Hypertension   . MRSA (methicillin resistant staph aureus) culture positive    Nasal cavity   Allergies  Allergen Reactions  . Sulfonamide Derivatives     REACTION: nausea and vommitting   Past Surgical History:  Procedure Laterality Date  . Cardaic ablasion  2005  . CERVICAL FUSION  2017   Family History  Problem Relation  Age of Onset  . Hypertension Mother   . Heart failure Mother   . Arthritis Mother   . Stroke Father   . Diabetes Father   . Heart disease Father   . Arthritis Sister    Social History   Social History  . Marital status: Married    Spouse name: Joe  . Number of  children: 2  . Years of education: 16   Occupational History  . Volley ball instructor    Social History Main Topics  . Smoking status: Never Smoker  . Smokeless tobacco: Never Used  . Alcohol use No  . Drug use: No  . Sexual activity: Yes    Partners: Male    Birth control/ protection: Post-menopausal     Comment: Married   Other Topics Concern  . Not on file   Social History Narrative   Married to [place]. 2 children Jomarie Longs and [place].   Bachelors degree. Part-time Marine scientist.   Drinks caffeine. Takes a daily vitamin.   Wears her seatbelt. Wears a bicycle helmet.   Exercises routinely.   Smoke detector in the home.   Feels safe in her relationship.   Allergies as of 07/29/2016      Reactions   Sulfonamide Derivatives    REACTION: nausea and vommitting      Medication List       Accurate as of 07/29/16  1:16 PM. Always use your most recent med list.          aspirin EC 81 MG tablet Take 81 mg by mouth daily.   BRINTELLIX 20 MG Tabs Generic drug:  vortioxetine HBr Take 20 mg by mouth daily.   CALCIUM PO Take 1 tablet by mouth daily.   Dexmethylphenidate HCl 40 MG Cp24 TK 2 CS PO AND 1 C QPM   doxycycline 100 MG capsule Commonly known as:  MONODOX Take 1 capsule (100 mg total) by mouth daily as needed (MERCER FLARE).   FISH OIL CONCENTRATE PO Take 1,400 mg by mouth.   hydrochlorothiazide 12.5 MG tablet Commonly known as:  HYDRODIURIL Take 1 tablet (12.5 mg total) by mouth daily.   lamoTRIgine 200 MG tablet Commonly known as:  LAMICTAL Take 200 mg by mouth daily.   magnesium 30 MG tablet Take 30 mg by mouth 2 (two) times daily.   multivitamin with minerals Tabs tablet Take 1 tablet by mouth daily.   omeprazole 40 MG capsule Commonly known as:  PRILOSEC TK 1 C PO QD   Vitamin D 2000 units Caps Take 1 capsule by mouth daily.        No results found for this or any previous visit (from the past 2160 hour(s)).  Dg Chest 2  View  Result Date: 09/18/2014 CLINICAL DATA:  Tachycardia. Headache. Shortness of breath. History of heart ablation. EXAM: CHEST  2 VIEW COMPARISON:  07/05/2013; 05/19/2004 FINDINGS: Unchanged cardiac silhouette and mediastinal contours. No focal parenchymal opacities. No pleural effusion or pneumothorax. No evidence of edema. No acute osseus abnormalities. IMPRESSION: No acute cardiopulmonary disease. Electronically Signed   By: Simonne Come M.D.   On: 09/18/2014 20:33     ROS: 14 pt review of systems performed and negative (unless mentioned in an HPI)  Objective: BP 125/85 (BP Location: Left Arm, Patient Position: Sitting, Cuff Size: Large)   Pulse (!) 102   Temp 97.9 F (36.6 C)   Resp 20   Ht 5\' 11"  (1.803 m)   Wt 181 lb 8 oz (82.3 kg)  LMP 04/15/2011   SpO2 100%   BMI 25.31 kg/m  Gen: Afebrile. No acute distress. Nontoxic in appearance, well-developed, well-nourished,  pleasant Caucasian female. HENT: AT. [place].  MMM, no oral lesions Eyes:Pupils Equal Round Reactive to light, Extraocular movements intact,  Conjunctiva without redness, discharge or icterus. Neck/lymp/endocrine: Supple, no thyromegaly CV: RRR, no edema, +2/4 P posterior tibialis pulses.  Chest: CTAB, no wheeze, rhonchi or crackles.  Abd: Soft. NTND. BS present.  Skin:  Warm and well-perfused. Skin intact. Neuro/Msk:  Normal gait. PERLA. EOMi. Alert. Oriented x3.   Psych: Normal affect, dress and demeanor. Normal speech. Normal thought content and judgment.  Assessment/plan: EZRAH DEMBECK is a 51 y.o. female present for establishment of care with  Hyperkalemia - We'll retest BMP today, last labs looks like potassium corrected after changing from lisinopril to HCTZ. - BASIC METABOLIC PANEL WITH GFR  Essential hypertension/hypercholesterolemia - Stable pressure today. Would not retest cholesterol at this time, only concerning factor with a very mildly elevated LDL at 150. Otherwise cholesterol panel was okay.  Encouraged patient to make dietary modifications, increase her exercise. She can also add fish oil supplementation to her regimen. - Continue HCTZ 12.5 mg daily. Refills prescribed for her today. -Low-salt diet, routine exercise. - BASIC METABOLIC PANEL WITH GFR - Follow-up June for hypertension and lipids, then would follow every 6 months.  Depression with anxiety - Patient is to continue to follow with Dr. Jennelle Human, all depression/anxiety medications are prescribed by their office.  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 Occasionally has flares, and will take doxycycline 100 mg daily during these times. - Refills on doxycycline for her today.  Daytime somnolence - New problem - Patient states this is a new problem that she has noticed worsening over the last few months, with a spouse report of possible apneic spells. Discussed the ability of sleep apnea, patient is agreeable to pulmonology referral and likely future sleep study. - Pulmonology referral placed today.  Return in about 3 months (around 11/08/2016), or lipids/HTN.  Greater than 45 minutes was spent with patient, greater than 50% of that time was spent face-to-face with patient counseling and/or coordinating care.       Electronically signed by: Felix Pacini, DO [facility]

## 2016-08-01 ENCOUNTER — Telehealth: Payer: Self-pay | Admitting: Family Medicine

## 2016-08-01 NOTE — Telephone Encounter (Signed)
Detailed message left on voice mail. Okay per DPR. 

## 2016-08-01 NOTE — Telephone Encounter (Signed)
Please call pt: - electrolytes and Co2 are normal.

## 2016-08-19 ENCOUNTER — Telehealth: Payer: Self-pay | Admitting: Family Medicine

## 2016-08-19 NOTE — Telephone Encounter (Signed)
Patient called seeking information about her referral to a sleep consultation. Asked to have a message left.

## 2016-08-19 NOTE — Telephone Encounter (Signed)
Spoke with patient she has been scheduled for 08/1916 but needed directions to location. Gave patient pulmonary phone number to call for information.

## 2016-09-09 ENCOUNTER — Encounter: Payer: Self-pay | Admitting: Pulmonary Disease

## 2016-09-09 ENCOUNTER — Ambulatory Visit (INDEPENDENT_AMBULATORY_CARE_PROVIDER_SITE_OTHER): Payer: BLUE CROSS/BLUE SHIELD | Admitting: Pulmonary Disease

## 2016-09-09 VITALS — BP 136/94 | HR 83 | Ht 72.0 in | Wt 183.6 lb

## 2016-09-09 DIAGNOSIS — R0683 Snoring: Secondary | ICD-10-CM

## 2016-09-09 NOTE — Progress Notes (Signed)
   Subjective:    Patient ID: Kayla Ortiz, female    DOB: 11/22/1965, 51 y.o.   MRN: [id number]  HPI    Review of Systems  Constitutional: Negative for fever and unexpected weight change.  HENT: Negative for congestion, dental problem, ear pain, nosebleeds, postnasal drip, rhinorrhea, sinus pressure, sneezing, sore throat and trouble swallowing.        Seasonal allergies  Eyes: Negative for redness and itching.  Respiratory: Negative for cough, chest tightness, shortness of breath and wheezing.   Cardiovascular: Negative for palpitations and leg swelling.  Gastrointestinal: Negative for nausea and vomiting.  Genitourinary: Negative for dysuria.  Musculoskeletal: Negative for joint swelling.  Skin: Negative for rash.  Neurological: Negative for headaches.  Hematological: Does not bruise/bleed easily.  Psychiatric/Behavioral: Negative for dysphoric mood. The patient is not nervous/anxious.        Objective:   Physical Exam        Assessment & Plan:

## 2016-09-09 NOTE — Progress Notes (Signed)
Past Surgical History She  has a past surgical history that includes Cardaic ablasion (2005) and Cervical fusion (2017).  Allergies  Allergen Reactions  . Sulfonamide Derivatives     REACTION: nausea and vommitting    Family History Her family history includes Arthritis in her mother and sister; Diabetes in her father; Heart disease in her father; Heart failure in her mother; Hypertension in her mother; Stroke in her father.  Social History She  reports that she has never smoked. She has never used smokeless tobacco. She reports that she does not drink alcohol or use drugs.  Review of systems Constitutional: Negative for fever and unexpected weight change.  HENT: Negative for congestion, dental problem, ear pain, nosebleeds, postnasal drip, rhinorrhea, sinus pressure, sneezing, sore throat and trouble swallowing.        Seasonal allergies  Eyes: Negative for redness and itching.  Respiratory: Negative for cough, chest tightness, shortness of breath and wheezing.   Cardiovascular: Negative for palpitations and leg swelling.  Gastrointestinal: Negative for nausea and vomiting.  Genitourinary: Negative for dysuria.  Musculoskeletal: Negative for joint swelling.  Skin: Negative for rash.  Neurological: Negative for headaches.  Hematological: Does not bruise/bleed easily.  Psychiatric/Behavioral: Negative for dysphoric mood. The patient is not nervous/anxious.     Current Outpatient Prescriptions on File Prior to Visit  Medication Sig  . aspirin EC 81 MG tablet Take 81 mg by mouth daily.  Marland Kitchen BRINTELLIX 20 MG TABS Take 20 mg by mouth daily.  Marland Kitchen CALCIUM PO Take 1 tablet by mouth daily.    . Cholecalciferol (VITAMIN D) 2000 UNITS CAPS Take 1 capsule by mouth daily.    Marland Kitchen Dexmethylphenidate HCl 40 MG CP24 TK 2 CS PO AND 1 C QPM  . doxycycline (MONODOX) 100 MG capsule Take 1 capsule (100 mg total) by mouth daily as needed (MERCER FLARE).  . hydrochlorothiazide (HYDRODIURIL) 12.5 MG tablet  Take 1 tablet (12.5 mg total) by mouth daily.  Marland Kitchen lamoTRIgine (LAMICTAL) 200 MG tablet Take 200 mg by mouth daily.    . magnesium 30 MG tablet Take 30 mg by mouth 2 (two) times daily.  . Multiple Vitamin (MULITIVITAMIN WITH MINERALS) TABS Take 1 tablet by mouth daily.    . Omega-3 Fatty Acids (FISH OIL CONCENTRATE PO) Take 1,400 mg by mouth.  Marland Kitchen omeprazole (PRILOSEC) 40 MG capsule TK 1 C PO QD (Patient taking differently: TK 1 C PO QD PRN)   No current facility-administered medications on file prior to visit.     Chief Complaint  Patient presents with  . Sleep Consult    Referred by Dr Claiborne Billings. Pt thinks that she may have had a sleep study a few years ago by Cardiology. Epworth Score: 14    Past medical history She  has a past medical history of Depression with anxiety; Dyspepsia; Hypercholesteremia; Hyperkalemia (2017); Hypertension; and MRSA (methicillin resistant staph aureus) culture positive.  Vital signs BP (!) 136/94 (BP Location: Left Arm, Cuff Size: Normal)   Pulse 83   Ht 6' (1.829 m)   Wt 183 lb 9.6 oz (83.3 kg)   LMP 04/15/2011   SpO2 96%   BMI 24.90 kg/m   History of Present Illness Kayla Ortiz is a 51 y.o. female for evaluation of sleep problems.  Her husband has been worried about her snoring.  This has been getting worse over the past 6 months.  She will stop breathing and then wake up.  She has trouble sleeping on her back.  Her sister has sleep apnea and is on a CPAP.  She goes to sleep between 11 pm and 2 am.  She falls asleep after about an hour.  She wakes up some times to use the bathroom.  She will also wake up randomly during the night, and will have trouble falling back to sleep  She gets out of bed at 730 am on weekdays to get her daughter ready for school.  She sleeps in until 11 am on weekends.  She feels tired in the morning no matter how long she sleeps.  She denies morning headache.  She does not use anything to help her fall sleep or stay  awake.  She denies sleep walking, sleep talking, bruxism, or nightmares.  There is no history of restless legs.  She denies sleep hallucinations, sleep paralysis, or cataplexy.  The Epworth score is 14 out of 24.   Physical Exam:  General - No distress ENT - No sinus tenderness, no oral exudate, no LAN, no thyromegaly, TM clear, pupils equal/reactive, MP 3 Cardiac - s1s2 regular, no murmur, pulses symmetric Chest - No wheeze/rales/dullness, good air entry, normal respiratory excursion Back - No focal tenderness Abd - Soft, non-tender, no organomegaly, + bowel sounds Ext - No edema Neuro - Normal strength, cranial nerves intact Skin - No rashes Psych - Normal mood, and behavior  Discussion: She has snoring, sleep disruption, witnessed apnea, and daytime sleepiness.  She has hx of HTN and depression.  She has family history of sleep apnea.  I am concerned she could sleep apnea also.  We discussed how sleep apnea can affect various health problems, including risks for hypertension, cardiovascular disease, and diabetes.  We also discussed how sleep disruption can increase risks for accidents, such as while driving.  Weight loss as a means of improving sleep apnea was also reviewed.  Additional treatment options discussed were CPAP therapy, oral appliance, and surgical intervention.   Assessment/plan:  Snoring with concern for obstructive sleep apnea. - will arrange for in lab sleep study - she might be a good candidate for oral appliance if she is found to have sleep apnea   Patient Instructions  Will arrange for in sleep study Will call to arrange for follow up after sleep study reviewed     Coralyn Helling, M.D. Pager [phone] 09/09/2016, 10:24 AM

## 2016-09-09 NOTE — Patient Instructions (Signed)
Will arrange for in sleep study Will call to arrange for follow up after sleep study reviewed  

## 2016-09-27 ENCOUNTER — Encounter: Payer: Self-pay | Admitting: Family Medicine

## 2016-09-27 ENCOUNTER — Ambulatory Visit (INDEPENDENT_AMBULATORY_CARE_PROVIDER_SITE_OTHER): Payer: BLUE CROSS/BLUE SHIELD | Admitting: Family Medicine

## 2016-09-27 VITALS — BP 121/89 | HR 93 | Temp 98.5°F | Resp 20 | Wt 177.5 lb

## 2016-09-27 DIAGNOSIS — J301 Allergic rhinitis due to pollen: Secondary | ICD-10-CM

## 2016-09-27 MED ORDER — CETIRIZINE HCL 10 MG PO TABS: 10.0000 mg | ORAL_TABLET | Freq: Every day | ORAL | 11 refills | Status: DC

## 2016-09-27 MED ORDER — FLUTICASONE PROPIONATE 50 MCG/ACT NA SUSP: 2.0000 | Freq: Every day | NASAL | 6 refills | Status: DC

## 2016-09-27 NOTE — Patient Instructions (Signed)
Allergies An allergy is when your body reacts to a substance in a way that is not normal. An allergic reaction can happen after you:  Eat something.  Breathe in something.  Touch something. You can be allergic to:  Things that are only around during certain seasons, like molds and pollens.  Foods.  Drugs.  Insects.  Animal dander. What are the signs or symptoms?  Puffiness (swelling). This may happen on the lips, face, tongue, mouth, or throat.  Sneezing.  Coughing.  Breathing loudly (wheezing).  Stuffy nose.  Tingling in the mouth.  A rash.  Itching.  Itchy, red, puffy areas of skin (hives).  Watery eyes.  Throwing up (vomiting).  Watery poop (diarrhea).  Dizziness.  Feeling faint or fainting.  Trouble breathing or swallowing.  A tight feeling in the chest.  A fast heartbeat. How is this diagnosed? Allergies can be diagnosed with:  A medical and family history.  Skin tests.  Blood tests.  A food diary. A food diary is a record of all the foods, drinks, and symptoms you have each day.  The results of an elimination diet. This diet involves making sure not to eat certain foods and then seeing what happens when you start eating them again. How is this treated? There is no cure for allergies, but allergic reactions can be treated with medicine. Severe reactions usually need to be treated at a hospital. How is this prevented? The best way to prevent an allergic reaction is to avoid the thing you are allergic to. Allergy shots and medicines can also help prevent reactions in some cases. This information is not intended to replace advice given to you by your health care provider. Make sure you discuss any questions you have with your health care provider. Document Released: 09/10/2012 Document Revised: 01/11/2016 Document Reviewed: 02/25/2014 Elsevier Interactive Patient Education  2017 [employer].   Start flonase and zyrtec daily.

## 2016-09-27 NOTE — Progress Notes (Signed)
Kayla Ortiz , 30-Oct-1965, 51 y.o., female MRN: [id number] Patient Care Team    Relationship Specialty Notifications Start End  Natalia Leatherwood, DO PCP - General Family Medicine  07/29/16   Lauraine Rinne., MD Attending Physician Psychiatry  07/29/16    Comment: [facility]  Ginnie Smart, MD Consulting Physician Infectious Diseases  07/29/16   Nestor Ramp Ob/Gyn    07/29/16     Chief Complaint  Patient presents with  . Eye Problem    bilat swollen red watery     Subjective: Pt presents for an OV with complaints of red eyes of 4 day duration.  Associated symptoms include itchiness and watery eyes. Started in her left eye, mildly red swollen and watery, then started in right eye a day later. Much better today than prior days. She has seasonal allergies, which are worse right now, but usually does not affect her eyes. She is taking Claritin daily.  She denies drainage or fever.    Depression screen PHQ 2/9 07/29/2016  Decreased Interest 0  Down, Depressed, Hopeless 0  PHQ - 2 Score 0    Allergies  Allergen Reactions  . Sulfonamide Derivatives     REACTION: nausea and vommitting   Social History  Substance Use Topics  . Smoking status: Never Smoker  . Smokeless tobacco: Never Used  . Alcohol use No   Past Medical History:  Diagnosis Date  . Depression with anxiety   . Dyspepsia   . Hypercholesteremia   . Hyperkalemia 2017   Very mild, felt to be from ACEi, resolved after med switched.   . Hypertension   . MRSA (methicillin resistant staph aureus) culture positive    Nasal cavity   Past Surgical History:  Procedure Laterality Date  . Cardaic ablasion  2005  . CERVICAL FUSION  2017   Family History  Problem Relation Age of Onset  . Hypertension Mother   . Heart failure Mother   . Arthritis Mother   . Stroke Father   . Diabetes Father   . Heart disease Father   . Arthritis Sister    Allergies as of 09/27/2016      Reactions   Sulfonamide  Derivatives    REACTION: nausea and vommitting      Medication List       Accurate as of 09/27/16 10:47 AM. Always use your most recent med list.          aspirin EC 81 MG tablet Take 81 mg by mouth daily.   BRINTELLIX 20 MG Tabs Generic drug:  vortioxetine HBr Take 20 mg by mouth daily.   CALCIUM PO Take 1 tablet by mouth daily.   Dexmethylphenidate HCl 40 MG Cp24 TK 2 CS PO AND 1 C QPM   doxycycline 100 MG capsule Commonly known as:  MONODOX Take 1 capsule (100 mg total) by mouth daily as needed (MERCER FLARE).   FISH OIL CONCENTRATE PO Take 1,400 mg by mouth.   hydrochlorothiazide 12.5 MG tablet Commonly known as:  HYDRODIURIL Take 1 tablet (12.5 mg total) by mouth daily.   lamoTRIgine 200 MG tablet Commonly known as:  LAMICTAL Take 200 mg by mouth daily.   magnesium 30 MG tablet Take 30 mg by mouth 2 (two) times daily.   multivitamin with minerals Tabs tablet Take 1 tablet by mouth daily.   Vitamin D 2000 units Caps Take 1 capsule by mouth daily.       All past medical history,  surgical history, allergies, family history, immunizations andmedications were updated in the EMR today and reviewed under the history and medication portions of their EMR.     ROS: Negative, with the exception of above mentioned in HPI   Objective:  BP 121/89 (BP Location: Right Arm, Patient Position: Sitting, Cuff Size: Large)   Pulse 93   Temp 98.5 F (36.9 C)   Resp 20   Wt 177 lb 8 oz (80.5 kg)   LMP 04/15/2011   SpO2 97%   BMI 24.07 kg/m  Body mass index is 24.07 kg/m. Gen: Afebrile. No acute distress. Nontoxic in appearance, well developed, well nourished.  HENT: AT. [place]. Bilateral TM visualized WNL. MMM, no oral lesions. Bilateral nares with mild erythema, no swelling, no drainage. Throat without erythema or exudates. No cough or hoarseness.  Eyes:Pupils Equal Round Reactive to light, Extraocular movements intact,  Right Conjunctiva with very mild  redness/injections, No discharge or icterus. Neck/lymp/endocrine: Supple,no lymphadenopathy  No exam data present No results found. No results found for this or any previous visit (from the past 24 hour(s)).  Assessment/Plan: Kayla Ortiz is a 51 y.o. female present for OV for  Seasonal allergic rhinitis due to pollen - appears to by allergy related, not infectious. Encouraged her to start flonase daily and change to zyrtec (from claritin) - cetirizine (ZYRTEC) 10 MG tablet; Take 1 tablet (10 mg total) by mouth daily.  Dispense: 30 tablet; Refill: 11 - fluticasone (FLONASE) 50 MCG/ACT nasal spray; Place 2 sprays into both nostrils daily.  Dispense: 16 g; Refill: 6 - F/U PRN   Reviewed expectations re: course of current medical issues.  Discussed self-management of symptoms.  Outlined signs and symptoms indicating need for more acute intervention.  Patient verbalized understanding and all questions were answered.  Patient received an After-Visit Summary.   Note is dictated utilizing voice recognition software. Although note has been proof read prior to signing, occasional typographical errors still can be missed. If any questions arise, please do not hesitate to call for verification.   electronically signed by:  Felix Pacini, DO  [facility] [place]

## 2016-10-11 ENCOUNTER — Telehealth: Payer: Self-pay | Admitting: *Deleted

## 2016-10-11 DIAGNOSIS — R0683 Snoring: Secondary | ICD-10-CM

## 2016-10-11 NOTE — Telephone Encounter (Signed)
-----   Message from Coralyn HellingVineet Sood, MD sent at 10/11/2016  1:15 PM EDT ----- That's fine.  Please enter order.  V   ----- Message ----- From: Tobe Sosttinger, Sally E Sent: 10/11/2016  12:32 PM To: Coralyn HellingVineet Sood, MD, Maisie FusAshtyn M Green, CMA  [facility] denied splitnight study do you want to do a home study if so I need an order Tobe SosSally E Ottinger

## 2016-10-11 NOTE — Telephone Encounter (Signed)
Spoke to pt she is aware the in lab study has been denied by [facility] and has been cancelled we will call her to set up home study when machine is available Kayla Ortiz

## 2016-10-11 NOTE — Telephone Encounter (Signed)
Order placed. Will send back to [facility] to ensure this is taken care of.

## 2016-10-31 ENCOUNTER — Encounter (HOSPITAL_BASED_OUTPATIENT_CLINIC_OR_DEPARTMENT_OTHER): Payer: Self-pay

## 2016-11-02 ENCOUNTER — Telehealth: Payer: Self-pay | Admitting: Family Medicine

## 2016-11-02 ENCOUNTER — Ambulatory Visit (INDEPENDENT_AMBULATORY_CARE_PROVIDER_SITE_OTHER): Payer: BLUE CROSS/BLUE SHIELD | Admitting: Family Medicine

## 2016-11-02 ENCOUNTER — Encounter: Payer: Self-pay | Admitting: Family Medicine

## 2016-11-02 VITALS — BP 116/80 | HR 75 | Temp 98.4°F | Resp 20 | Ht 72.0 in | Wt 178.2 lb

## 2016-11-02 DIAGNOSIS — E7800 Pure hypercholesterolemia, unspecified: Secondary | ICD-10-CM | POA: Diagnosis not present

## 2016-11-02 DIAGNOSIS — I10 Essential (primary) hypertension: Secondary | ICD-10-CM

## 2016-11-02 LAB — BASIC METABOLIC PANEL
BUN: 19 mg/dL (ref 6–23)
CALCIUM: 9.8 mg/dL (ref 8.4–10.5)
CO2: 33 meq/L — AB (ref 19–32)
CREATININE: 0.82 mg/dL (ref 0.40–1.20)
Chloride: 102 mEq/L (ref 96–112)
GFR: 78.46 mL/min (ref >60.00)
Glucose, Bld: 95 mg/dL (ref 70–99)
Potassium: 4.3 mEq/L (ref 3.5–5.1)
Sodium: 139 mEq/L (ref 135–145)

## 2016-11-02 LAB — LIPID PANEL
Cholesterol: 203 mg/dL — ABNORMAL HIGH (ref 0–200)
HDL: 57.1 mg/dL (ref >39.00)
LDL Cholesterol: 128 mg/dL — ABNORMAL HIGH (ref 0–99)
NONHDL: 145.9
Total CHOL/HDL Ratio: 4
Triglycerides: 88 mg/dL (ref 0.0–149.0)
VLDL: 17.6 mg/dL (ref 0.0–40.0)

## 2016-11-02 MED ORDER — HYDROCHLOROTHIAZIDE 12.5 MG PO TABS: 12.5000 mg | ORAL_TABLET | Freq: Every day | ORAL | 1 refills | Status: DC

## 2016-11-02 NOTE — Patient Instructions (Signed)
Nice to see you today. We will call you with your labs.  Please help [place] help you:  We are honored you have chosen [facility] for your Primary Care home. Below you will find basic instructions that you may need to access in the future. Please help [place] help you by reading the instructions, which cover many of the frequent questions we experience.   Prescription refills and request:  -In order to allow more efficient response time, please call your pharmacy for all refills. They will forward the request electronically to [place]. This allows for the quickest possible response. Request left on a nurse line can take longer to refill, since these are checked as time allows between office patients and other phone calls.  - refill request can take up to 3-5 working days to complete.  - If request is sent electronically and request is appropiate, it is usually completed in 1-2 business days.  - all patients will need to be seen routinely for all chronic medical conditions requiring prescription medications (see follow-up below). If you are overdue for follow up on your condition, you will be asked to make an appointment and we will call in enough medication to cover you until your appointment (up to 30 days).  - all controlled substances will require a face to face visit to request/refill.  - if you desire your prescriptions to go through a new pharmacy, and have an active script at original pharmacy, you will need to call your pharmacy and have scripts transferred to new pharmacy. This is completed between the pharmacy locations and not by your provider.    Results: If any images or labs were ordered, it can take up to 1 week to get results depending on the test ordered and the lab/facility running and resulting the test. - Normal or stable results, which do not need further discussion, may be released to your mychart immediately with attached note to you. A call may not be generated for normal results.  Please make certain to sign up for mychart. If you have questions on how to activate your mychart you can call the front office.  - If your results need further discussion, our office will attempt to contact you via phone, and if unable to reach you after 2 attempts, we will release your abnormal result to your mychart with instructions.  - All results will be automatically released in mychart after 1 week.  - Your provider will provide you with explanation and instruction on all relevant material in your results. Please keep in mind, results and labs may appear confusing or abnormal to the untrained eye, but it does not mean they are actually abnormal for you personally. If you have any questions about your results that are not covered, or you desire more detailed explanation than what was provided, you should make an appointment with your provider to do so.   Our office handles many outgoing and incoming calls daily. If we have not contacted you within 1 week about your results, please check your mychart to see if there is a message first and if not, then contact our office.  In helping with this matter, you help decrease call volume, and therefore allow [place] to be able to respond to patients needs more efficiently.   Acute office visits (sick visit):  An acute visit is intended for a new problem and are scheduled in shorter time slots to allow schedule openings for patients with new problems. This is the  appropriate visit to discuss a new problem. In order to provide you with excellent quality medical care with proper time for you to explain your problem, have an exam and receive treatment with instructions, these appointments should be limited to one new problem per visit. If you experience a new problem, in which you desire to be addressed, please make an acute office visit, we save openings on the schedule to accommodate you. Please do not save your new problem for any other type of visit, let us take  care of it properly and quickly for you.   Follow up visits:  Depending on your condition(s) your provider will need to see you routinely in order to provide you with quality care and prescribe medication(s). Most chronic conditions (Example: hypertension, Diabetes, depression/anxiety... etc), require visits a couple times a year. Your provider will instruct you on proper follow up for your personal medical conditions and history. Please make certain to make follow up appointments for your condition as instructed. Failing to do so could result in lapse in your medication treatment/refills. If you request a refill, and are overdue to be seen on a condition, we will always provide you with a 30 day script (once) to allow you time to schedule.    Medicare wellness (well visit): - we have a wonderful Nurse Selena Batten), that will meet with you and provide you will yearly medicare wellness visits. These visits should occur yearly (can not be scheduled less than 1 calendar year apart) and cover preventive health, immunizations, advance directives and screenings you are entitled to yearly through your medicare benefits. Do not miss out on your entitled benefits, this is when medicare will pay for these benefits to be ordered for you.  These are strongly encouraged by your provider and is the appropriate type of visit to make certain you are up to date with all preventive health benefits. If you have not had your medicare wellness exam in the last 12 months, please make certain to schedule one by calling the office and schedule your medicare wellness with Selena Batten as soon as possible.   Yearly physical (well visit):  - Adults are recommended to be seen yearly for physicals. Check with your insurance and date of your last physical, most insurances require one calendar year between physicals. Physicals include all preventive health topics, screenings, medical exam and labs that are appropriate for gender/age and history. You  may have fasting labs needed at this visit. This is a well visit (not a sick visit), new problems should not be covered during this visit (see acute visit).  - Pediatric patients are seen more frequently when they are younger. Your provider will advise you on well child visit timing that is appropriate for your their age. - This is not a medicare wellness visit. Medicare wellness exams do not have an exam portion to the visit. Some medicare companies allow for a physical, some do not allow a yearly physical. If your medicare allows a yearly physical you can schedule the medicare wellness with our nurse Selena Batten and have your physical with your provider after, on the same day. Please check with insurance for your full benefits.   Late Policy/No Shows:  - all new patients should arrive 15-30 minutes earlier than appointment to allow [place] time  to  obtain all personal demographics,  insurance information and for you to complete office paperwork. - All established patients should arrive 10-15 minutes earlier than appointment time to update all information and be checked  in .  - In our best efforts to run on time, if you are late for your appointment you will be asked to either reschedule or if able, we will work you back into the schedule. There will be a wait time to work you back in the schedule,  depending on availability.  - If you are unable to make it to your appointment as scheduled, please call 24 hours ahead of time to allow us to fill the time slot with someone else who needs to be seen. If you do not cancel your appointment ahead of time, you may be charged a no show fee.

## 2016-11-02 NOTE — Telephone Encounter (Signed)
Please call pt: - her labs look good.  - her cholesterol is improved and her bad cholesterol is now 128 (prior 150).  - LDL < 130 is good, closer to 100 is great.  - please ask her the dosage of her fish oil, she was checking on this for us. She reported 1400 mg, but was not sure. Please correct dose in EMR if not correct.    Lipid Panel     Component Value Date/Time   CHOL 203 (H) 11/02/2016 0830   TRIG 88.0 11/02/2016 0830   HDL 57.10 11/02/2016 0830   CHOLHDL 4 11/02/2016 0830   VLDL 17.6 11/02/2016 0830   LDLCALC 128 (H) 11/02/2016 0830

## 2016-11-02 NOTE — Progress Notes (Signed)
Patient ID: Kayla Ortiz, female  DOB: 05/01/1966, 51 y.o.   MRN: [id number] Patient Care Team    Relationship Specialty Notifications Start End  Natalia LeatherwoodKuneff, Renee A, DO PCP - General Family Medicine  07/29/16   Cottle, Steva Readyarey G Jr., MD Attending Physician Psychiatry  07/29/16    Comment: [facility]  Hatcher, Lacretia LeighJeffrey C, MD Consulting Physician Infectious Diseases  07/29/16   [facility], [facility] [place] [place]    07/29/16     Subjective:  Kayla MurphyChristine H Ortiz is a 51 y.o.  female present for  Hypertension/hyperlipidemia: Pt reports compliance with HCTZ 12.5. Blood pressures ranges at home within normal limits. Patient denies chest pain, shortness of breath or lower extremity edema. Pt takes a daily baby ASA. Pt is not prescribed statin. She does take fish oil 1400 mg daily. She had elevated cholesterol on outside labs about 6 months ago. BMP: March 2008, within normal limits CBC: April 2016 within normal limits Diet: Low-sodium  Exercise: Routinely RF: FHX heart disease. Hypertension.   Depression screen PHQ 2/9 07/29/2016  Decreased Interest 0  Down, Depressed, Hopeless 0  PHQ - 2 Score 0    There is no immunization history on file for this patient.   Past Medical History:  Diagnosis Date  . Depression with anxiety   . Dyspepsia   . Hypercholesteremia   . Hyperkalemia 2017   Very mild, felt to be from ACEi, resolved after med switched.   . Hypertension   . MRSA (methicillin resistant staph aureus) culture positive    Nasal cavity   Allergies  Allergen Reactions  . Sulfonamide Derivatives     REACTION: nausea and vommitting   Past Surgical History:  Procedure Laterality Date  . Cardaic ablasion  2005  . CERVICAL FUSION  2017   Family History  Problem Relation Age of Onset  . Hypertension Mother   . Heart failure Mother   . Arthritis Mother   . Stroke Father   . Diabetes Father   . Heart disease Father   . Arthritis Sister    Social History   Social History  .  Marital status: Married    Spouse name: Joe  . Number of children: 2  . Years of education: 16   Occupational History  . 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 Smoking status: Never Smoker  . Smokeless tobacco: Never Used  . Alcohol use No  . Drug use: No  . Sexual activity: Yes    Partners: Male    Birth control/ protection: Post-menopausal     Comment: Married   Other Topics Concern  . Not on file   Social History Narrative   Married to [place]. 2 children Jomarie LongsJoseph and Preston Heightssabel.   Bachelors degree. Part-time Marine scientistvolleyball instructor.   Drinks caffeine. Takes a daily vitamin.   Wears her seatbelt. Wears a bicycle helmet.   Exercises routinely.   Smoke detector in the home.   Feels safe in her relationship.   Allergies as of 11/02/2016      Reactions   Sulfonamide Derivatives    REACTION: nausea and vommitting      Medication List       Accurate as of 11/02/16 12:09 PM. Always use your most recent med list.          aspirin EC 81 MG tablet Take 81 mg by mouth daily.   BRINTELLIX 20 MG Tabs Generic drug:  vortioxetine HBr Take 20  mg by mouth daily.   CALCIUM PO Take 1 tablet by mouth daily.   cetirizine 10 MG tablet Commonly known as:  ZYRTEC Take 1 tablet (10 mg total) by mouth daily.   Dexmethylphenidate HCl 40 MG Cp24 TK 2 CS PO AND 1 C QPM   doxycycline 100 MG capsule Commonly known as:  MONODOX Take 1 capsule (100 mg total) by mouth daily as needed (MERCER FLARE).   FISH OIL CONCENTRATE PO Take 1,400 mg by mouth.   fluticasone 50 MCG/ACT nasal spray Commonly known as:  FLONASE Place 2 sprays into both nostrils daily.   hydrochlorothiazide 12.5 MG tablet Commonly known as:  HYDRODIURIL Take 1 tablet (12.5 mg total) by mouth daily.   lamoTRIgine 200 MG tablet Commonly known as:  LAMICTAL Take 200 mg by mouth daily.   magnesium 30 MG tablet Take 30 mg by mouth 2 (two) times daily.   multivitamin with minerals Tabs  tablet Take 1 tablet by mouth daily.   Vitamin D 2000 units Caps Take 1 capsule by mouth daily.      No results found for this or any previous visit (from the past 2160 hour(s)).  Dg Chest 2 View  Result Date: 09/18/2014 CLINICAL DATA:  Tachycardia. Headache. Shortness of breath. History of heart ablation. EXAM: CHEST  2 VIEW COMPARISON:  07/05/2013; 05/19/2004 FINDINGS: Unchanged cardiac silhouette and mediastinal contours. No focal parenchymal opacities. No pleural effusion or pneumothorax. No evidence of edema. No acute osseus abnormalities. IMPRESSION: No acute cardiopulmonary disease. Electronically Signed   By: Simonne Come M.D.   On: 09/18/2014 20:33   ROS: 14 pt review of systems performed and negative (unless mentioned in an HPI)  Objective: BP 116/80 (BP Location: Right Arm, Patient Position: Sitting, Cuff Size: Large)   Pulse 75   Temp 98.4 F (36.9 C)   Resp 20   Ht 6' (1.829 m)   Wt 178 lb 4 oz (80.9 kg)   LMP 04/15/2011   SpO2 97%   BMI 24.18 kg/m  Gen: Afebrile. No acute distress.  HENT: AT. Kayla Ortiz. MMM.  Eyes:Pupils Equal Round Reactive to light, Extraocular movements intact,  Conjunctiva without redness, discharge or icterus. CV: RRR no murmur, no edema, +2/4 P posterior tibialis pulses Chest: CTAB, no wheeze or crackles Abd: Soft. NTND. BS present. no Masses palpated.   Neuro:  Normal gait. PERLA. EOMi. Alert. Oriented.   Assessment/plan: Kayla Ortiz is a 51 y.o. female present for follow-up on Essential hypertension/hypercholesterolemia - Pressures are stable today. Continue HCTZ 12.5 mg daily. Prior history of hyperkalemia on lisinopril. Refills prescribed today.  - BMP and lipid panel collected today.  -Low-salt diet, routine exercise. - Follow-up every 6 months hypertension, yearly on cholesterol as long as stable.   Return in about 6 months (around 05/04/2017) for HTN.   Electronically signed by: Felix Pacini, DO [facility]

## 2016-11-03 NOTE — Telephone Encounter (Signed)
Message left on voice mail for patient to return call. 

## 2016-11-03 NOTE — Telephone Encounter (Signed)
Patient notified and verbalized understanding. She will call back with Fish oil dosage.

## 2016-11-11 DIAGNOSIS — G4733 Obstructive sleep apnea (adult) (pediatric): Secondary | ICD-10-CM | POA: Diagnosis not present

## 2016-11-15 ENCOUNTER — Telehealth: Payer: Self-pay | Admitting: Pulmonary Disease

## 2016-11-15 ENCOUNTER — Encounter: Payer: Self-pay | Admitting: Pulmonary Disease

## 2016-11-15 DIAGNOSIS — G4733 Obstructive sleep apnea (adult) (pediatric): Secondary | ICD-10-CM | POA: Insufficient documentation

## 2016-11-15 HISTORY — DX: Obstructive sleep apnea (adult) (pediatric): G47.33

## 2016-11-15 NOTE — Telephone Encounter (Signed)
HST 11/11/16 >> AHI 8.3, SaO2 low 76%   Will have my nurse inform pt that sleep study shows mild sleep apnea.  Options are 1) CPAP now, 2) ROV first.  If She is agreeable to CPAP, then please send order for auto CPAP range 5 to 15 cm H2O with heated humidity and mask of choice.  Have download sent 1 month after starting CPAP and set up ROV 2 months after starting CPAP.  ROV can be with me or NP.

## 2016-11-16 DIAGNOSIS — G4733 Obstructive sleep apnea (adult) (pediatric): Secondary | ICD-10-CM | POA: Diagnosis not present

## 2016-11-17 ENCOUNTER — Other Ambulatory Visit: Payer: Self-pay | Admitting: *Deleted

## 2016-11-17 DIAGNOSIS — R0683 Snoring: Secondary | ICD-10-CM

## 2016-11-22 ENCOUNTER — Other Ambulatory Visit: Payer: Self-pay

## 2016-11-22 DIAGNOSIS — G4733 Obstructive sleep apnea (adult) (pediatric): Secondary | ICD-10-CM

## 2016-11-22 NOTE — Telephone Encounter (Signed)
Spoke with pt, advised results and she is agreeable to CPAP. I put an order in for CPAP. Pt verbalized understanding and nothing further is needed.

## 2016-11-27 ENCOUNTER — Other Ambulatory Visit: Payer: Self-pay | Admitting: Family Medicine

## 2016-12-11 ENCOUNTER — Other Ambulatory Visit: Payer: Self-pay | Admitting: Family Medicine

## 2017-04-24 ENCOUNTER — Ambulatory Visit (HOSPITAL_BASED_OUTPATIENT_CLINIC_OR_DEPARTMENT_OTHER)
Admission: RE | Admit: 2017-04-24 | Discharge: 2017-04-24 | Disposition: A | Discharge status: Home / Self Care | Payer: BLUE CROSS/BLUE SHIELD | Source: Ambulatory Visit | Attending: Family Medicine | Admitting: Family Medicine

## 2017-04-24 ENCOUNTER — Ambulatory Visit: Payer: BLUE CROSS/BLUE SHIELD | Admitting: Family Medicine

## 2017-04-24 ENCOUNTER — Encounter: Payer: Self-pay | Admitting: Family Medicine

## 2017-04-24 VITALS — BP 118/82 | HR 96 | Temp 97.9°F | Resp 20 | Wt 172.8 lb

## 2017-04-24 DIAGNOSIS — R079 Chest pain, unspecified: Secondary | ICD-10-CM | POA: Diagnosis not present

## 2017-04-24 DIAGNOSIS — R142 Eructation: Secondary | ICD-10-CM | POA: Diagnosis not present

## 2017-04-24 DIAGNOSIS — I10 Essential (primary) hypertension: Secondary | ICD-10-CM | POA: Diagnosis not present

## 2017-04-24 DIAGNOSIS — E7800 Pure hypercholesterolemia, unspecified: Secondary | ICD-10-CM

## 2017-04-24 LAB — TROPONIN I

## 2017-04-24 MED ORDER — OMEPRAZOLE 40 MG PO CPDR: 40.0000 mg | DELAYED_RELEASE_CAPSULE | Freq: Every day | ORAL | 0 refills | Status: DC

## 2017-04-24 NOTE — Patient Instructions (Addendum)
Start prilosec and tums/zantac OTC.  We will order chest xray and run labs today. These will not be returned until tomorrow. If chest pain worsens in the meantime please be seen by  ED.    This sounds like GERD symptoms. Please start the regimen above and we will call you with results as they are available.   Angina Pectoris Angina pectoris is a very bad feeling in the chest, neck, or arm. Your doctor may call it angina. There are four types of angina. Angina is caused by a lack of blood in the middle and thickest layer of the heart wall (myocardium). Angina may feel like a crushing or squeezing pain in the chest. It may feel like tightness or heavy pressure in the chest. Some people say it feels like gas, heartburn, or indigestion. Some people have symptoms other than pain. These include:  Shortness of breath.  Cold sweats.  Feeling sick to your stomach (nausea).  Feeling light-headed.  Many women have chest discomfort and some of the other symptoms. However, women often have different symptoms, such as:  Feeling tired (fatigue).  Feeling nervous for no reason.  Feeling weak for no reason.  Dizziness or fainting.  Women may have angina without any symptoms. Follow these instructions at home:  Take medicines only as told by your doctor.  Take care of other health issues as told by your doctor. These include: ? High blood pressure (hypertension). ? Diabetes.  Follow a heart-healthy diet. Your doctor can help you to choose healthy food options and make changes.  Talk to your doctor to learn more about healthy cooking methods and use them. These include: ? Roasting. ? Grilling. ? Broiling. ? Baking. ? Poaching. ? Steaming. ? Stir-frying.  Follow an exercise program approved by your doctor.  Keep a healthy weight. Lose weight as told by your doctor.  Rest when you are tired.  Learn to manage stress.  Do not use any tobacco, such as cigarettes, chewing tobacco, or  electronic cigarettes. If you need help quitting, ask your doctor.  If you drink alcohol, and your doctor says it is okay, limit yourself to no more than 1 drink per day. One drink equals 12 ounces of beer, 5 ounces of wine, or 1 ounces of hard liquor.  Stop illegal drug use.  Keep all follow-up visits as told by your doctor. This is important. Do not take these medicines unless your doctor says that you can:  Nonsteroidal anti-inflammatory drugs (NSAIDs). These include: ? Ibuprofen. ? Naproxen. ? Celecoxib.  Vitamin supplements that have vitamin A, vitamin E, or both.  Hormone therapy that contains estrogen with or without progestin.  Get help right away if:  You have pain in your chest, neck, arm, jaw, stomach, or back that: ? Lasts more than a few minutes. ? Comes back. ? Does not get better after you take medicine under your tongue (sublingual nitroglycerin).  You have any of these symptoms for no reason: ? Gas, heartburn, or indigestion. ? Sweating a lot. ? Shortness of breath or trouble breathing. ? Feeling sick to your stomach or throwing up. ? Feeling more tired than usual. ? Feeling nervous or worrying more than usual. ? Feeling weak. ? Diarrhea.  You are suddenly dizzy or light-headed.  You faint or pass out. These symptoms may be an emergency. Do not wait to see if the symptoms will go away. Get medical help right away. Call your local emergency services (911 in the [place] Do not  drive yourself to the hospital. This information is not intended to replace advice given to you by your health care provider. Make sure you discuss any questions you have with your health care provider. Document Released: 11/02/2007 Document Revised: 10/22/2015 Document Reviewed: 09/17/2013 Elsevier Interactive Patient Education  2017 Elsevier Inc.  Heartburn Heartburn is a type of pain or discomfort that can happen in the throat or chest. It is often described as a burning pain. It  may also cause a bad taste in the mouth. Heartburn may feel worse when you lie down or bend over, and it is often worse at night. Heartburn may be caused by stomach contents that move back up into the esophagus (reflux). Follow these instructions at home: Take these actions to decrease your discomfort and to help avoid complications. Diet  Follow a diet as recommended by your health care provider. This may involve avoiding foods and drinks such as: ? Coffee and tea (with or without caffeine). ? Drinks that contain alcohol. ? Energy drinks and sports drinks. ? Carbonated drinks or sodas. ? Chocolate and cocoa. ? Peppermint and mint flavorings. ? Garlic and onions. ? Horseradish. ? Spicy and acidic foods, including peppers, chili powder, curry powder, vinegar, hot sauces, and barbecue sauce. ? Citrus fruit juices and citrus fruits, such as oranges, lemons, and limes. ? Tomato-based foods, such as red sauce, chili, salsa, and pizza with red sauce. ? Fried and fatty foods, such as donuts, french fries, potato chips, and high-fat dressings. ? High-fat meats, such as hot dogs and fatty cuts of red and white meats, such as rib eye steak, sausage, ham, and bacon. ? High-fat dairy items, such as whole milk, butter, and cream cheese.  Eat small, frequent meals instead of large meals.  Avoid drinking large amounts of liquid with your meals.  Avoid eating meals during the 2-3 hours before bedtime.  Avoid lying down right after you eat.  Do not exercise right after you eat. General instructions  Pay attention to any changes in your symptoms.  Take over-the-counter and prescription medicines only as told by your health care provider. Do not take aspirin, ibuprofen, or other NSAIDs unless your health care provider told you to do so.  Do not use any tobacco products, including cigarettes, chewing tobacco, and e-cigarettes. If you need help quitting, ask your health care provider.  Wear  loose-fitting clothing. Do not wear anything tight around your waist that causes pressure on your abdomen.  Raise (elevate) the head of your bed about 6 inches (15 cm).  Try to reduce your stress, such as with yoga or meditation. If you need help reducing stress, ask your health care provider.  If you are overweight, reduce your weight to an amount that is healthy for you. Ask your health care provider for guidance about a safe weight loss goal.  Keep all follow-up visits as told by your health care provider. This is important. Contact a health care provider if:  You have new symptoms.  You have unexplained weight loss.  You have difficulty swallowing, or it hurts to swallow.  You have wheezing or a persistent cough.  Your symptoms do not improve with treatment.  You have frequent heartburn for more than two weeks. Get help right away if:  You have pain in your arms, neck, jaw, teeth, or back.  You feel sweaty, dizzy, or light-headed.  You have chest pain or shortness of breath.  You vomit and your vomit looks like blood or coffee  grounds.  Your stool is bloody or black. This information is not intended to replace advice given to you by your health care provider. Make sure you discuss any questions you have with your health care provider. Document Released: 10/02/2008 Document Revised: 10/22/2015 Document Reviewed: 09/10/2014 Elsevier Interactive Patient Education  2017 [employer].    Food Choices for Gastroesophageal Reflux Disease, Adult When you have gastroesophageal reflux disease (GERD), the foods you eat and your eating habits are very important. Choosing the right foods can help ease your discomfort. What guidelines do I need to follow?  Choose fruits, vegetables, whole grains, and low-fat dairy products.  Choose low-fat meat, fish, and poultry.  Limit fats such as oils, salad dressings, butter, nuts, and avocado.  Keep a food diary. This helps you identify  foods that cause symptoms.  Avoid foods that cause symptoms. These may be different for everyone.  Eat small meals often instead of 3 large meals a day.  Eat your meals slowly, in a place where you are relaxed.  Limit fried foods.  Cook foods using methods other than frying.  Avoid drinking alcohol.  Avoid drinking large amounts of liquids with your meals.  Avoid bending over or lying down until 2-3 hours after eating. What foods are not recommended? These are some foods and drinks that may make your symptoms worse: Vegetables Tomatoes. Tomato juice. Tomato and spaghetti sauce. Chili peppers. Onion and garlic. Horseradish. Fruits Oranges, grapefruit, and lemon (fruit and juice). Meats High-fat meats, fish, and poultry. This includes hot dogs, ribs, ham, sausage, salami, and bacon. Dairy Whole milk and chocolate milk. Sour cream. Cream. Butter. Ice cream. Cream cheese. Drinks Coffee and tea. Bubbly (carbonated) drinks or energy drinks. Condiments Hot sauce. Barbecue sauce. Sweets/Desserts Chocolate and cocoa. Donuts. Peppermint and spearmint. Fats and Oils High-fat foods. This includes [place] fries and potato chips. Other Vinegar. Strong spices. This includes black pepper, white pepper, red pepper, cayenne, curry powder, cloves, ginger, and chili powder. The items listed above may not be a complete list of foods and drinks to avoid. Contact your dietitian for more information. This information is not intended to replace advice given to you by your health care provider. Make sure you discuss any questions you have with your health care provider. Document Released: 11/15/2011 Document Revised: 10/22/2015 Document Reviewed: 03/20/2013 Elsevier Interactive Patient Education  2017 [employer].

## 2017-04-24 NOTE — Progress Notes (Signed)
Kayla Ortiz , June 18, 1965, 51 y.o., female MRN: [id number] Patient Care Team    Relationship Specialty Notifications Start End  Ma Hillock, DO PCP - General Family Medicine  07/29/16   Cottle, Billey Co., MD Attending Physician Psychiatry  07/29/16    Comment: [facility]  Hatcher, Doroteo Bradford, MD Consulting Physician Infectious Diseases  07/29/16   Ob/Gyn, E[facility]    07/29/16     Chief Complaint  Patient presents with  . Chest Pain    started 4 hours ago     Subjective:  Chest pain: Pt presents to OV with complaints of chest pain located midsternum and epigastric, that started 10:30 AM today and still present, did not radiate. Patient states they had finished breakfast at the onset of pain. She endorses belching. She denies nausea, vomit or diuresis. Nothing seems to make the pain better or worse. She has tried no over-the-counter regimen to help with discomfort. Risk factors include:  HTN, dyslipidemia, family h/o of CHD,  Pt >50. Recent drug use denies. Anxiey denies any increase in anxiety or depression. No recent increase in activity or heavy lifting. She thought about going to the emergency room. However, she decided to be seen here since she was at  a doctor's office with her husband for his appointment in the afternoon.  Depression screen PHQ 2/9 07/29/2016  Decreased Interest 0  Down, Depressed, Hopeless 0  PHQ - 2 Score 0    Allergies  Allergen Reactions  . Sulfonamide Derivatives     REACTION: nausea and vommitting   Social History   Tobacco Use  . Smoking status: Never Smoker  . Smokeless tobacco: Never Used  Substance Use Topics  . Alcohol use: No   Past Medical History:  Diagnosis Date  . Depression with anxiety   . Dyspepsia   . Hypercholesteremia   . Hyperkalemia 2017   Very mild, felt to be from ACEi, resolved after med switched.   . Hypertension   . MRSA (methicillin resistant staph aureus) culture positive    Nasal cavity  . OSA  (obstructive sleep apnea) 11/15/2016   Past Surgical History:  Procedure Laterality Date  . Cardaic ablasion  2005  . CERVICAL FUSION  2017   Family History  Problem Relation Age of Onset  . Hypertension Mother   . Heart failure Mother   . Arthritis Mother   . Stroke Father   . Diabetes Father   . Heart disease Father   . Arthritis Sister    Allergies as of 04/24/2017      Reactions   Sulfonamide Derivatives    REACTION: nausea and vommitting      Medication List        Accurate as of 04/24/17  5:57 PM. Always use your most recent med list.          aspirin EC 81 MG tablet Take 81 mg by mouth daily.   BRINTELLIX 20 MG Tabs Generic drug:  vortioxetine HBr Take 20 mg by mouth daily.   CALCIUM PO Take 1 tablet by mouth daily.   cetirizine 10 MG tablet Commonly known as:  ZYRTEC Take 1 tablet (10 mg total) by mouth daily.   Dexmethylphenidate HCl 40 MG Cp24 TK 2 CS PO AND 1 C QPM   doxycycline 100 MG capsule Commonly known as:  MONODOX TAKE 1 CAPSULE BY MOUTH EVERY DAY AS NEEDED   FISH OIL CONCENTRATE PO Take 1,400 mg by mouth.   fluticasone  50 MCG/ACT nasal spray Commonly known as:  FLONASE Place 2 sprays into both nostrils daily.   hydrochlorothiazide 12.5 MG tablet Commonly known as:  HYDRODIURIL Take 1 tablet (12.5 mg total) by mouth daily.   lamoTRIgine 200 MG tablet Commonly known as:  LAMICTAL Take 200 mg by mouth daily.   magnesium 30 MG tablet Take 30 mg by mouth 2 (two) times daily.   multivitamin with minerals Tabs tablet Take 1 tablet by mouth daily.   omeprazole 40 MG capsule Commonly known as:  PRILOSEC Take 1 capsule (40 mg total) by mouth daily.   Vitamin D 2000 units Caps Take 1 capsule by mouth daily.       All past medical history, surgical history, allergies, family history, immunizations andmedications were updated in the EMR today and reviewed under the history and medication portions of their EMR.     ROS:  Negative, with the exception of above mentioned in HPI   Objective:  BP 118/82 (BP Location: Left Arm, Patient Position: Sitting, Cuff Size: Large)   Pulse 96   Temp 97.9 F (36.6 C)   Resp 20   Wt 172 lb 12.8 oz (78.4 kg)   LMP 04/15/2011   SpO2 97%   BMI 23.44 kg/m  Body mass index is 23.44 kg/m. Gen: Afebrile. No acute distress. Nontoxic in appearance, well developed, well nourished. Pleasant Caucasian female. HENT: AT. [place].  MMM, no oral lesions. No cough or hoarseness.  Eyes:Pupils Equal Round Reactive to light, Extraocular movements intact,  Conjunctiva without redness, discharge or icterus. Neck/lymp/endocrine: Supple, no lymphadenopathy CV: RRR no murmur, no edema. No tenderness to palpation chest wall. Chest: CTAB, no wheeze or crackles. Breath sounds equal bilaterally. Good air movement, normal resp effort.  Abd: Soft. NTND. BS present. No Masses palpated. No rebound or guarding.  Neuro:  Normal gait. PERLA. EOMi. Alert. Oriented x3 Cranial nerves II through XII intact.  Psych: Normal affect, dress and demeanor. Normal speech. Normal thought content and judgment. EKG: Hr 95, PR 136, QT 356. Unchanged when compared to prior EKG.  No exam data present No results found. No results found for this or any previous visit (from the past 24 hour(s)).  Assessment/Plan: Kayla Ortiz is a 51 y.o. female present for OV for  Chest pain, unspecified type/hypertension/hyperlipidemia - Heart score ~3, low risk. VSS. EKG unchanged from prior. Troponin ordered. Patient aware these results will not be available until tomorrow. Pain is not reproducible on exam. Appears to be more consistent with reflux. This was discussed with patient today. We'll start PPI while  chest pain workup is completed. - Baby ASA if can tolerate.  - EKG 12-Lead - omeprazole (PRILOSEC) 40 MG capsule; Take 1 capsule (40 mg total) by mouth daily.  Dispense: 30 capsule; Refill: 0 - H. pylori antibody, IgG - DG  Chest 2 View; Future - Troponin I - Lipase - Comp Met (CMET) - CBC w/Diff - AVS education on chest pain and GERD.  - pt educated severe chest pain should be seen in the ED immediately, if worsens.    Belching - omeprazole (PRILOSEC) 40 MG capsule; Take 1 capsule (40 mg total) by mouth daily.  Dispense: 30 capsule; Refill: 0 - Start OTC Zantac or Tums. - H. pylori antibody, IgG - DG Chest 2 View; Future - Follow-up depending upon imaging and lab results.   Reviewed expectations re: course of current medical issues.  Discussed self-management of symptoms.  Outlined signs and symptoms indicating need for  more acute intervention.  Patient verbalized understanding and all questions were answered.  Patient received an After-Visit Summary.    Orders Placed This Encounter  Procedures  . DG Chest 2 View  . H. pylori antibody, IgG  . Troponin I  . Lipase  . Comp Met (CMET)  . CBC w/Diff  . EKG 12-Lead     Note is dictated utilizing voice recognition software. Although note has been proof read prior to signing, occasional typographical errors still can be missed. If any questions arise, please do not hesitate to call for verification.   electronically signed by:  Howard Pouch, DO  [place]

## 2017-04-25 ENCOUNTER — Telehealth: Payer: Self-pay | Admitting: Pulmonary Disease

## 2017-04-25 ENCOUNTER — Telehealth: Payer: Self-pay

## 2017-04-25 DIAGNOSIS — G4733 Obstructive sleep apnea (adult) (pediatric): Secondary | ICD-10-CM

## 2017-04-25 LAB — CBC WITH DIFFERENTIAL/PLATELET
BASOS PCT: 0.6 %
Basophils Absolute: 32 cells/uL (ref 0–200)
EOS PCT: 4.2 %
Eosinophils Absolute: 227 cells/uL (ref 15–500)
HCT: 41.4 % (ref 35.0–45.0)
Hemoglobin: 14.3 g/dL (ref 11.7–15.5)
Lymphs Abs: 1453 cells/uL (ref 850–3900)
MCH: 28.6 pg (ref 27.0–33.0)
MCHC: 34.5 g/dL (ref 32.0–36.0)
MCV: 82.8 fL (ref 80.0–100.0)
MONOS PCT: 8.5 %
MPV: 12.2 fL (ref 7.5–12.5)
NEUTROS ABS: 3229 {cells}/uL (ref 1500–7800)
Neutrophils Relative %: 59.8 %
PLATELETS: 276 10*3/uL (ref 140–400)
RBC: 5 10*6/uL (ref 3.80–5.10)
RDW: 12.3 % (ref 11.0–15.0)
TOTAL LYMPHOCYTE: 26.9 %
WBC mixed population: 459 cells/uL (ref 200–950)
WBC: 5.4 10*3/uL (ref 3.8–10.8)

## 2017-04-25 LAB — COMPREHENSIVE METABOLIC PANEL
AG RATIO: 1.5 (calc) (ref 1.0–2.5)
ALT: 13 U/L (ref 6–29)
AST: 16 U/L (ref 10–35)
Albumin: 4.3 g/dL (ref 3.6–5.1)
Alkaline phosphatase (APISO): 69 U/L (ref 33–130)
BILIRUBIN TOTAL: 0.4 mg/dL (ref 0.2–1.2)
BUN: 16 mg/dL (ref 7–25)
CALCIUM: 10.3 mg/dL (ref 8.6–10.4)
CO2: 36 mmol/L — AB (ref 20–32)
Chloride: 103 mmol/L (ref 98–110)
Creat: 0.92 mg/dL (ref 0.50–1.05)
GLUCOSE: 91 mg/dL (ref 65–99)
Globulin: 2.9 g/dL (calc) (ref 1.9–3.7)
Potassium: 4.9 mmol/L (ref 3.5–5.3)
Sodium: 143 mmol/L (ref 135–146)
Total Protein: 7.2 g/dL (ref 6.1–8.1)

## 2017-04-25 LAB — LIPASE: Lipase: 11 U/L (ref 7–60)

## 2017-04-25 LAB — H. PYLORI ANTIBODY, IGG: H Pylori IgG: NEGATIVE

## 2017-04-25 NOTE — Telephone Encounter (Signed)
Spoke with pt, she went to primary care and she stated her CO2 levels are increasing and may be due to CPAP use. Her last reading 36 and PCP thinks we need to look at CPAP setting. I printed a download for VS to look at if this helps and will place in his look at folder VS please advise.

## 2017-04-25 NOTE — Telephone Encounter (Signed)
Dr Claiborne BillingsKuneff aware of result

## 2017-04-25 NOTE — Telephone Encounter (Signed)
Pt is aware of below message and voiced her understanding. ONO has been ordered. Pt states she will call back to schedule f/u. Nothing further is needed at this time

## 2017-04-25 NOTE — Telephone Encounter (Signed)
[facility] - Client TELEPHONE ADVICE RECORD [facility] Patient Name: Kayla HalCHRISTINE Delira Gender: Female DOB: 11/03/1965 Age: [age] Y 4 M 23 D Return Phone Number: Address: City/State/Zip: [place] Statistic[facility]Client [facility] - Client [facility] Physician Felix PaciniKuneff, Renee Contact Type Call Who Is Calling Lab [facility] Phone Number [phone] Lab Tech Name Delynn FlavinKathleen Lab Reference Number [id number] D Chief Complaint Lab Result (Critical or Stat) Call Type Lab Send to RN Reason for Call Report lab results Initial Comment Caller states is Nicholos JohnsKathleen w/[facility], have stat lab results. Additional Comment Translation No Nurse Assessment Nurse: Vonita MossPeterson, RN, Doug Date/Time (Eastern Time): 04/24/2017 7:30:39 PM Is there an on-call provider listed? ---Yes Please list name of person reporting value (Lab Employee) and a contact number. ---Casimiro NeedleMichael [phone] Please document the following items: Lab name Lab value (read back to lab to verify) Reference range for lab value Date and time blood was drawn Collect time of birth for bilirubin results ---[facility] troponin less than 0.1 range is , less than or equal to 0.05 04/24/2017 , 1521 today Please collect the patient contact information from the lab. (name, phone number and address) ---Kayla Ortiz [phone] List any special notes provided by lab. ---none Guidelines Guideline Title Affirmed Question Affirmed Notes Nurse Date/Time (Eastern Time) Disp. Time Lamount Cohen(Eastern Time) Disposition Final User 04/24/2017 7:56:14 PM Called On-Call Provider Vonita MossPeterson, RN, Doug 04/24/2017 8:04:42 PM Called On-Call Provider Vonita MossPeterson, RN, Doug Reason: Dr Drue NovelPaz about abnormal lab value 04/24/2017 8:04:56 PM Clinical Call Yes Vonita MossPeterson, RN, Doug PLEASE NOTE: All timestamps contained within this report are represented as [place]  Standard Time. CONFIDENTIALTY NOTICE: This fax transmission is intended only for the addressee. It contains information that is legally privileged, confidential or otherwise protected from use or disclosure. If you are not the intended recipient, you are strictly prohibited from reviewing, disclosing, copying using or disseminating any of this information or taking any action in reliance on or regarding this information. If you have received this fax in error, please notify us immediately by telephone so that we can arrange for its return to us. Phone: [phone], Toll-Free: [phone], Fax: [phone] Page: 2 of 2 Call Id: [id number] Paging DoctorName Phone DateTime Result/Outcome Message Type Notes Willow Oraaz, Jose - MD [phone] 04/24/2017 7:56:14 PM Called On Call Provider - Reached Doctor Paged Willow OraPaz, Jose - MD 04/24/2017 8:01:10 PM Spoke with On Call - General Message Result Dr Drue NovelPaz answered : reported lab value , troponin level is less than 0.1

## 2017-04-25 NOTE — Telephone Encounter (Signed)
Auto CPAP 03/26/17 to 04/24/17 >> used on 29 of 30 nights with average 6 hrs 13 min.  Average AHI 1.8 with median CPAP 7 and 95 th percentile CPAP 9 cm H2O.   CMP Latest Ref Rng & Units 04/24/2017 11/02/2016 07/29/2016  Glucose 65 - 99 mg/dL 91 95 161(W103(H)  BUN 7 - 25 mg/dL 16 19 19   Creatinine 0.50 - 1.05 mg/dL 9.600.92 4.540.82 0.980.84  Sodium 135 - 146 mmol/L 143 139 141  Potassium 3.5 - 5.3 mmol/L 4.9 4.3 5.2  Chloride 98 - 110 mmol/L 103 102 102  CO2 20 - 32 mmol/L 36(H) 33(H) 30  Calcium 8.6 - 10.4 mg/dL 11.910.3 9.8 9.7  Total Protein 6.1 - 8.1 g/dL 7.2 - -  Total Bilirubin 0.2 - 1.2 mg/dL 0.4 - -  Alkaline Phos 39 - 117 U/L - - -  AST 10 - 35 U/L 16 - -  ALT 6 - 29 U/L 13 - -    CBC Latest Ref Rng & Units 04/24/2017 09/18/2014 07/05/2013  WBC 3.8 - 10.8 Thousand/uL 5.4 9.0 4.9  Hemoglobin 11.7 - 15.5 g/dL 14.714.3 82.914.9 56.213.3  Hematocrit 35.0 - 45.0 % 41.4 45.5 40.0  Platelets 140 - 400 Thousand/uL 276 265 206    Please let her know her CPAP report shows good control of sleep apnea on current settings.    Please arrange for ONO with CPAP to determine if her oxygen level is low at night.  She also needs ROV scheduled with me or NP to further assess status of sleep apnea therapy.

## 2017-04-28 ENCOUNTER — Encounter: Payer: Self-pay | Admitting: Family Medicine

## 2017-05-02 ENCOUNTER — Encounter: Payer: Self-pay | Admitting: Pulmonary Disease

## 2017-05-04 ENCOUNTER — Ambulatory Visit: Payer: Self-pay | Admitting: Family Medicine

## 2017-05-05 ENCOUNTER — Telehealth: Payer: Self-pay | Admitting: Pulmonary Disease

## 2017-05-05 ENCOUNTER — Encounter: Payer: Self-pay | Admitting: Pulmonary Disease

## 2017-05-05 ENCOUNTER — Telehealth: Payer: Self-pay

## 2017-05-05 NOTE — Telephone Encounter (Signed)
Please see other phone note from today.

## 2017-05-05 NOTE — Telephone Encounter (Signed)
ONO with CPAP 05/02/17 >> test time 7 hrs 56 min.  Basal SpO2 96.3%, low SpO2 89%.  Spent 0.1 min with SpO2 < 89%.   Please let her know her oxygen test was normal.  No change to current CPAP settings needed.

## 2017-05-05 NOTE — Telephone Encounter (Signed)
Message from Mychart, Generic sent at 05/05/2017 1:30 PM EST -----    I had an in home test checking my oxygen levels while using my CPAP in Dec 4th.  Is there any indication that my CPAP needs to have adjustments? And if so is this something that can be set remotely?  No rush in responding.  Thanks for all you do!   Kayla Ortiz   [phone]   VS, download in your look at folder.

## 2017-05-05 NOTE — Telephone Encounter (Signed)
Called pt letting her know the results of her ono with cpap and that no change needed to be made with her cpap settings. Pt expressed understanding. Nothing further needed.

## 2017-05-05 NOTE — Telephone Encounter (Signed)
Pt expressed understanding with the results of her ono with cpap. Nothing further needed.

## 2017-06-19 ENCOUNTER — Other Ambulatory Visit: Payer: Self-pay | Admitting: Family Medicine

## 2017-06-19 DIAGNOSIS — J301 Allergic rhinitis due to pollen: Secondary | ICD-10-CM

## 2017-07-26 ENCOUNTER — Other Ambulatory Visit: Payer: Self-pay | Admitting: Family Medicine

## 2017-08-01 ENCOUNTER — Encounter: Payer: Self-pay | Admitting: *Deleted

## 2017-08-01 ENCOUNTER — Encounter: Payer: Self-pay | Admitting: Gastroenterology

## 2017-08-27 ENCOUNTER — Other Ambulatory Visit: Payer: Self-pay | Admitting: Family Medicine

## 2017-08-27 DIAGNOSIS — I10 Essential (primary) hypertension: Secondary | ICD-10-CM

## 2017-08-28 ENCOUNTER — Other Ambulatory Visit: Payer: Self-pay | Admitting: *Deleted

## 2017-08-28 DIAGNOSIS — I10 Essential (primary) hypertension: Secondary | ICD-10-CM

## 2017-08-28 MED ORDER — HYDROCHLOROTHIAZIDE 12.5 MG PO TABS: 12.5000 mg | ORAL_TABLET | Freq: Every day | ORAL | 0 refills | Status: DC

## 2017-09-25 ENCOUNTER — Ambulatory Visit: Payer: Self-pay | Admitting: Family Medicine

## 2017-09-29 ENCOUNTER — Encounter: Payer: Self-pay | Admitting: Gastroenterology

## 2017-10-02 ENCOUNTER — Other Ambulatory Visit: Payer: Self-pay | Admitting: Family Medicine

## 2017-10-02 DIAGNOSIS — I10 Essential (primary) hypertension: Secondary | ICD-10-CM

## 2017-10-05 ENCOUNTER — Ambulatory Visit: Payer: BLUE CROSS/BLUE SHIELD | Admitting: Family Medicine

## 2017-10-05 ENCOUNTER — Encounter: Payer: Self-pay | Admitting: Family Medicine

## 2017-10-05 VITALS — BP 114/68 | HR 74 | Resp 16 | Ht 72.0 in | Wt 170.0 lb

## 2017-10-05 DIAGNOSIS — Z22322 Carrier or suspected carrier of Methicillin resistant Staphylococcus aureus: Secondary | ICD-10-CM | POA: Diagnosis not present

## 2017-10-05 DIAGNOSIS — I10 Essential (primary) hypertension: Secondary | ICD-10-CM | POA: Diagnosis not present

## 2017-10-05 DIAGNOSIS — E7800 Pure hypercholesterolemia, unspecified: Secondary | ICD-10-CM | POA: Diagnosis not present

## 2017-10-05 MED ORDER — DOXYCYCLINE MONOHYDRATE 100 MG PO CAPS
100.0000 mg | ORAL_CAPSULE | Freq: Every day | ORAL | 5 refills | Status: DC | PRN
Start: 2017-10-05 — End: 2018-03-12

## 2017-10-05 MED ORDER — HYDROCHLOROTHIAZIDE 12.5 MG PO TABS: 12.5000 mg | ORAL_TABLET | Freq: Every day | ORAL | 1 refills | Status: DC

## 2017-10-05 NOTE — Progress Notes (Signed)
Kayla Ortiz , [birth year], 52 y.o., female MRN: [id number] Patient Care Team    Relationship Specialty Notifications Start End  Natalia Leatherwood, DO PCP - General Family Medicine  07/29/16   Cottle, Steva Ready., MD Attending Physician Psychiatry  07/29/16    Comment: [facility]  Hatcher, Lacretia Leigh, MD Consulting Physician Infectious Diseases  07/29/16   Ob/Gyn, Nestor Ramp    07/29/16     Chief Complaint  Patient presents with  . Follow-up    HTN, discuss medications     Subjective:   Hypertension/HLD: Pt reports compliance with HCTZ 12.5. Blood pressures ranges at home are not routinely checked.. Patient denies chest pain, shortness of breath or lower extremity edema.  BMP: 04/24/2017 WNL CBC: 04/24/2017 WNL Lipid: 10/2016 ch 203, HDL 57, LDL 128, tg 88 Diet: low sodium Exercise: routine exercise.  RF: HTN,.   Depression screen PHQ 2/9 07/29/2016  Decreased Interest 0  Down, Depressed, Hopeless 0  PHQ - 2 Score 0    Allergies  Allergen Reactions  . Sulfonamide Derivatives     REACTION: nausea and vommitting   Social History   Tobacco Use  . Smoking status: Never Smoker  . Smokeless tobacco: Never Used  Substance Use Topics  . Alcohol use: No   Past Medical History:  Diagnosis Date  . Depression with anxiety   . Dyspepsia   . Hypercholesteremia   . Hyperkalemia 2017   Very mild, felt to be from ACEi, resolved after med switched.   . Hypertension   . MRSA (methicillin resistant staph aureus) culture positive    Nasal cavity  . OSA (obstructive sleep apnea) 11/15/2016   Past Surgical History:  Procedure Laterality Date  . Cardaic ablasion  2005  . CERVICAL FUSION  2017   Family History  Problem Relation Age of Onset  . Hypertension Mother   . Heart failure Mother   . Arthritis Mother   . Stroke Father   . Diabetes Father   . Heart disease Father   . Arthritis Sister    Allergies as of 10/05/2017      Reactions   Sulfonamide Derivatives      REACTION: nausea and vommitting      Medication List        Accurate as of 10/05/17  8:41 AM. Always use your most recent med list.          aspirin EC 81 MG tablet Take 81 mg by mouth daily.   BRINTELLIX 20 MG Tabs tablet Generic drug:  vortioxetine HBr Take 20 mg by mouth daily.   buPROPion 300 MG 24 hr tablet Commonly known as:  WELLBUTRIN XL   CALCIUM PO Take 1 tablet by mouth daily.   cetirizine 10 MG tablet Commonly known as:  ZYRTEC TAKE 1 TABLET BY MOUTH EVERY DAY   Dexmethylphenidate HCl 40 MG Cp24 TK 2 CS PO AND 1 C QPM   doxycycline 100 MG capsule Commonly known as:  MONODOX TAKE ONE CAPSULE BY MOUTH EVERY DAY AS NEEDED.   FISH OIL CONCENTRATE PO Take 1,400 mg by mouth.   fluticasone 50 MCG/ACT nasal spray Commonly known as:  FLONASE Place 2 sprays into both nostrils daily.   hydrochlorothiazide 12.5 MG tablet Commonly known as:  HYDRODIURIL TAKE 1 TABLET BY MOUTH EVERY DAY   lamoTRIgine 200 MG tablet Commonly known as:  LAMICTAL Take 200 mg by mouth daily.   magnesium 30 MG tablet Take 30 mg by mouth 2 (  two) times daily.   multivitamin with minerals Tabs tablet Take 1 tablet by mouth daily.   omeprazole 40 MG capsule Commonly known as:  PRILOSEC Take 1 capsule (40 mg total) by mouth daily.   Vitamin D 2000 units Caps Take 1 capsule by mouth daily.       All past medical history, surgical history, allergies, family history, immunizations andmedications were updated in the EMR today and reviewed under the history and medication portions of their EMR.     ROS: Negative, with the exception of above mentioned in HPI   Objective:  BP 114/68 (BP Location: Right Arm, Patient Position: Sitting, Cuff Size: Normal)   Pulse 74   Resp 16   Ht 6' (1.829 m)   Wt 170 lb (77.1 kg)   LMP 04/15/2011   SpO2 98%   BMI 23.06 kg/m  Body mass index is 23.06 kg/m.  Gen: Afebrile. No acute distress. Pleasant caucasian female. Appears sad today.   HENT: AT. [place]. MMM.  Eyes:Pupils Equal Round Reactive to light, Extraocular movements intact,  Conjunctiva without redness, discharge or icterus.y CV: RRR no murmur, no edema, +2/4 P posterior tibialis pulses Chest: CTAB, no wheeze or crackles Skin: no rashes, purpura or petechiae.  Neuro:  Normal gait. PERLA. EOMi. Alert. Oriented x3 Psych: Sad today. Normal affect, dress and demeanor. Normal speech. Normal thought content and judgment..    No exam data present No results found. No results found for this or any previous visit (from the past 24 hour(s)).  Assessment/Plan: Kayla Ortiz is a 52 y.o. female present for OV for  Essential hypertension/HLD - stable. Labs UTD.  - family member w/ recent traumatic experience. She is sad today, but coping.  - hydrochlorothiazide (HYDRODIURIL) 12.5 MG tablet; Take 1 tablet (12.5 mg total) by mouth daily.  Dispense: 90 tablet; Refill: 1 F/u 6 months.   MRSA (methicillin resistant Staphylococcus aureus) colonization Doxy prescribed. Nasal sores with MRSA. Uses PRN.   Reviewed expectations re: course of current medical issues.  Discussed self-management of symptoms.  Outlined signs and symptoms indicating need for more acute intervention.  Patient verbalized understanding and all questions were answered.  Patient received an After-Visit Summary.    No orders of the defined types were placed in this encounter.    Note is dictated utilizing voice recognition software. Although note has been proof read prior to signing, occasional typographical errors still can be missed. If any questions arise, please do not hesitate to call for verification.   electronically signed by:  Felix Pacini, DO  [facility] [place]

## 2017-10-05 NOTE — Patient Instructions (Signed)
AS long as doing well, see you six months.   Please help [place] help you:  We are honored you have chosen [facility] for your [facility] home. Below you will find basic instructions that you may need to access in the future. Please help [place] help you by reading the instructions, which cover many of the frequent questions we experience.   Prescription refills and request:  -In order to allow more efficient response time, please call your pharmacy for all refills. They will forward the request electronically to [place]. This allows for the quickest possible response. Request left on a nurse line can take longer to refill, since these are checked as time allows between office patients and other phone calls.  - refill request can take up to 3-5 working days to complete.  - If request is sent electronically and request is appropiate, it is usually completed in 1-2 business days.  - all patients will need to be seen routinely for all chronic medical conditions requiring prescription medications (see follow-up below). If you are overdue for follow up on your condition, you will be asked to make an appointment and we will call in enough medication to cover you until your appointment (up to 30 days).  - all controlled substances will require a face to face visit to request/refill.  - if you desire your prescriptions to go through a new pharmacy, and have an active script at original pharmacy, you will need to call your pharmacy and have scripts transferred to new pharmacy. This is completed between the pharmacy locations and not by your provider.    Results: If any images or labs were ordered, it can take up to 1 week to get results depending on the test ordered and the lab/facility running and resulting the test. - Normal or stable results, which do not need further discussion, may be released to your mychart immediately with attached note to you. A call may not be generated for normal results. Please make  certain to sign up for mychart. If you have questions on how to activate your mychart you can call the front office.  - If your results need further discussion, our office will attempt to contact you via phone, and if unable to reach you after 2 attempts, we will release your abnormal result to your mychart with instructions.  - All results will be automatically released in mychart after 1 week.  - Your provider will provide you with explanation and instruction on all relevant material in your results. Please keep in mind, results and labs may appear confusing or abnormal to the untrained eye, but it does not mean they are actually abnormal for you personally. If you have any questions about your results that are not covered, or you desire more detailed explanation than what was provided, you should make an appointment with your provider to do so.   Our office handles many outgoing and incoming calls daily. If we have not contacted you within 1 week about your results, please check your mychart to see if there is a message first and if not, then contact our office.  In helping with this matter, you help decrease call volume, and therefore allow [place] to be able to respond to patients needs more efficiently.   Acute office visits (sick visit):  An acute visit is intended for a new problem and are scheduled in shorter time slots to allow schedule openings for patients with new problems. This is the appropriate visit  to discuss a new problem. Problems will not be addressed by phone call or Echart message. Appointment is needed if requesting treatment. In order to provide you with excellent quality medical care with proper time for you to explain your problem, have an exam and receive treatment with instructions, these appointments should be limited to one new problem per visit. If you experience a new problem, in which you desire to be addressed, please make an acute office visit, we save openings on the schedule  to accommodate you. Please do not save your new problem for any other type of visit, let us take care of it properly and quickly for you.   Follow up visits:  Depending on your condition(s) your provider will need to see you routinely in order to provide you with quality care and prescribe medication(s). Most chronic conditions (Example: hypertension, Diabetes, depression/anxiety... etc), require visits a couple times a year. Your provider will instruct you on proper follow up for your personal medical conditions and history. Please make certain to make follow up appointments for your condition as instructed. Failing to do so could result in lapse in your medication treatment/refills. If you request a refill, and are overdue to be seen on a condition, we will always provide you with a 30 day script (once) to allow you time to schedule.    Medicare wellness (well visit): - we have a wonderful Nurse Maudie Mercury), that will meet with you and provide you will yearly medicare wellness visits. These visits should occur yearly (can not be scheduled less than 1 calendar year apart) and cover preventive health, immunizations, advance directives and screenings you are entitled to yearly through your medicare benefits. Do not miss out on your entitled benefits, this is when medicare will pay for these benefits to be ordered for you.  These are strongly encouraged by your provider and is the appropriate type of visit to make certain you are up to date with all preventive health benefits. If you have not had your medicare wellness exam in the last 12 months, please make certain to schedule one by calling the office and schedule your medicare wellness with Maudie Mercury as soon as possible.   Yearly physical (well visit):  - Adults are recommended to be seen yearly for physicals. Check with your insurance and date of your last physical, most insurances require one calendar year between physicals. Physicals include all preventive health  topics, screenings, medical exam and labs that are appropriate for gender/age and history. You may have fasting labs needed at this visit. This is a well visit (not a sick visit), new problems should not be covered during this visit (see acute visit).  - Pediatric patients are seen more frequently when they are younger. Your provider will advise you on well child visit timing that is appropriate for your their age. - This is not a medicare wellness visit. Medicare wellness exams do not have an exam portion to the visit. Some medicare companies allow for a physical, some do not allow a yearly physical. If your medicare allows a yearly physical you can schedule the medicare wellness with our nurse Maudie Mercury and have your physical with your provider after, on the same day. Please check with insurance for your full benefits.   Late Policy/No Shows:  - all new patients should arrive 15-30 minutes earlier than appointment to allow [place] time  to  obtain all personal demographics,  insurance information and for you to complete office paperwork. - All established patients  should arrive 10-15 minutes earlier than appointment time to update all information and be checked in .  - In our best efforts to run on time, if you are late for your appointment you will be asked to either reschedule or if able, we will work you back into the schedule. There will be a wait time to work you back in the schedule,  depending on availability.  - If you are unable to make it to your appointment as scheduled, please call 24 hours ahead of time to allow [place] to fill the time slot with someone else who needs to be seen. If you do not cancel your appointment ahead of time, you may be charged a no show fee.

## 2017-11-06 ENCOUNTER — Encounter: Payer: Self-pay | Admitting: Gastroenterology

## 2017-11-14 ENCOUNTER — Encounter: Payer: Self-pay | Admitting: Family Medicine

## 2017-12-07 ENCOUNTER — Other Ambulatory Visit: Payer: Self-pay

## 2017-12-07 ENCOUNTER — Ambulatory Visit (AMBULATORY_SURGERY_CENTER): Payer: Self-pay | Admitting: *Deleted

## 2017-12-07 VITALS — Ht 72.0 in | Wt 175.2 lb

## 2017-12-07 DIAGNOSIS — Z1211 Encounter for screening for malignant neoplasm of colon: Secondary | ICD-10-CM

## 2017-12-07 NOTE — Progress Notes (Signed)
No egg or soy allergy known to patient  No issues with past sedation with any surgeries  or procedures, no intubation problems  No diet pills per patient No home 02 use per patient  No blood thinners per patient  Pt denies issues with constipation  No A fib or A flutter  EMMI video sent to pt's e mail   During PV pt states she wants to do colo guard not colonoscopy.  Pt give info for colo guard to call her insurance company to see if it's covered, she will then contact her PCP to order the colo guard- if it's not covered , she will call us back to RS as she cannot do the 12-20-17 scheduled colon due to work conflict- cancelled colon as scheduled for now

## 2017-12-11 ENCOUNTER — Ambulatory Visit: Payer: BLUE CROSS/BLUE SHIELD | Admitting: Family Medicine

## 2017-12-12 ENCOUNTER — Ambulatory Visit (INDEPENDENT_AMBULATORY_CARE_PROVIDER_SITE_OTHER): Payer: BLUE CROSS/BLUE SHIELD | Admitting: Family Medicine

## 2017-12-12 ENCOUNTER — Ambulatory Visit: Payer: Self-pay | Admitting: Family Medicine

## 2017-12-12 ENCOUNTER — Encounter: Payer: Self-pay | Admitting: Family Medicine

## 2017-12-12 VITALS — BP 109/73 | HR 75 | Temp 97.5°F | Resp 20 | Ht 72.0 in | Wt 173.2 lb

## 2017-12-12 DIAGNOSIS — Z23 Encounter for immunization: Secondary | ICD-10-CM

## 2017-12-12 DIAGNOSIS — I10 Essential (primary) hypertension: Secondary | ICD-10-CM

## 2017-12-12 DIAGNOSIS — J301 Allergic rhinitis due to pollen: Secondary | ICD-10-CM | POA: Diagnosis not present

## 2017-12-12 DIAGNOSIS — Z0000 Encounter for general adult medical examination without abnormal findings: Secondary | ICD-10-CM | POA: Diagnosis not present

## 2017-12-12 DIAGNOSIS — E7800 Pure hypercholesterolemia, unspecified: Secondary | ICD-10-CM

## 2017-12-12 MED ORDER — FLUTICASONE PROPIONATE 50 MCG/ACT NA SUSP: 2.0000 | Freq: Every day | NASAL | 6 refills | Status: AC

## 2017-12-12 NOTE — Patient Instructions (Signed)
Dont forget to call to make your appt for mammogram and colonoscopy. Have them send [place] a copy after completed.   Health Maintenance, Female Adopting a healthy lifestyle and getting preventive care can go a long way to promote health and wellness. Talk with your health care provider about what schedule of regular examinations is right for you. This is a good chance for you to check in with your provider about disease prevention and staying healthy. In between checkups, there are plenty of things you can do on your own. Experts have done a lot of research about which lifestyle changes and preventive measures are most likely to keep you healthy. Ask your health care provider for more information. Weight and diet Eat a healthy diet  Be sure to include plenty of vegetables, fruits, low-fat dairy products, and lean protein.  Do not eat a lot of foods high in solid fats, added sugars, or salt.  Get regular exercise. This is one of the most important things you can do for your health. ? Most adults should exercise for at least 150 minutes each week. The exercise should increase your heart rate and make you sweat (moderate-intensity exercise). ? Most adults should also do strengthening exercises at least twice a week. This is in addition to the moderate-intensity exercise.  Maintain a healthy weight  Body mass index (BMI) is a measurement that can be used to identify possible weight problems. It estimates body fat based on height and weight. Your health care provider can help determine your BMI and help you achieve or maintain a healthy weight.  For females 63 years of age and older: ? A BMI below 18.5 is considered underweight. ? A BMI of 18.5 to 24.9 is normal. ? A BMI of 25 to 29.9 is considered overweight. ? A BMI of 30 and above is considered obese.  Watch levels of cholesterol and blood lipids  You should start having your blood tested for lipids and cholesterol at 51 years of age, then have  this test every 5 years.  You may need to have your cholesterol levels checked more often if: ? Your lipid or cholesterol levels are high. ? You are older than 51 years of age. ? You are at high risk for heart disease.  Cancer screening Lung Cancer  Lung cancer screening is recommended for adults 12-57 years old who are at high risk for lung cancer because of a history of smoking.  A yearly low-dose CT scan of the lungs is recommended for people who: ? Currently smoke. ? Have quit within the past 15 years. ? Have at least a 30-pack-year history of smoking. A pack year is smoking an average of one pack of cigarettes a day for 1 year.  Yearly screening should continue until it has been 15 years since you quit.  Yearly screening should stop if you develop a health problem that would prevent you from having lung cancer treatment.  Breast Cancer  Practice breast self-awareness. This means understanding how your breasts normally appear and feel.  It also means doing regular breast self-exams. Let your health care provider know about any changes, no matter how small.  If you are in your 20s or 30s, you should have a clinical breast exam (CBE) by a health care provider every 1-3 years as part of a regular health exam.  If you are 11 or older, have a CBE every year. Also consider having a breast X-ray (mammogram) every year.  If you have  a family history of breast cancer, talk to your health care provider about genetic screening.  If you are at high risk for breast cancer, talk to your health care provider about having an MRI and a mammogram every year.  Breast cancer gene (BRCA) assessment is recommended for women who have family members with BRCA-related cancers. BRCA-related cancers include: ? Breast. ? Ovarian. ? Tubal. ? Peritoneal cancers.  Results of the assessment will determine the need for genetic counseling and BRCA1 and BRCA2 testing.  Cervical Cancer Your health care  provider may recommend that you be screened regularly for cancer of the pelvic organs (ovaries, uterus, and vagina). This screening involves a pelvic examination, including checking for microscopic changes to the surface of your cervix (Pap test). You may be encouraged to have this screening done every 3 years, beginning at age 52.  For women ages 4-65, health care providers may recommend pelvic exams and Pap testing every 3 years, or they may recommend the Pap and pelvic exam, combined with testing for human papilloma virus (HPV), every 5 years. Some types of HPV increase your risk of cervical cancer. Testing for HPV may also be done on women of any age with unclear Pap test results.  Other health care providers may not recommend any screening for nonpregnant women who are considered low risk for pelvic cancer and who do not have symptoms. Ask your health care provider if a screening pelvic exam is right for you.  If you have had past treatment for cervical cancer or a condition that could lead to cancer, you need Pap tests and screening for cancer for at least 20 years after your treatment. If Pap tests have been discontinued, your risk factors (such as having a new sexual partner) need to be reassessed to determine if screening should resume. Some women have medical problems that increase the chance of getting cervical cancer. In these cases, your health care provider may recommend more frequent screening and Pap tests.  Colorectal Cancer  This type of cancer can be detected and often prevented.  Routine colorectal cancer screening usually begins at 51 years of age and continues through 51 years of age.  Your health care provider may recommend screening at an earlier age if you have risk factors for colon cancer.  Your health care provider may also recommend using home test kits to check for hidden blood in the stool.  A small camera at the end of a tube can be used to examine your colon  directly (sigmoidoscopy or colonoscopy). This is done to check for the earliest forms of colorectal cancer.  Routine screening usually begins at age 32.  Direct examination of the colon should be repeated every 5-10 years through 51 years of age. However, you may need to be screened more often if early forms of precancerous polyps or small growths are found.  Skin Cancer  Check your skin from head to toe regularly.  Tell your health care provider about any new moles or changes in moles, especially if there is a change in a mole's shape or color.  Also tell your health care provider if you have a mole that is larger than the size of a pencil eraser.  Always use sunscreen. Apply sunscreen liberally and repeatedly throughout the day.  Protect yourself by wearing long sleeves, pants, a wide-brimmed hat, and sunglasses whenever you are outside.  Heart disease, diabetes, and high blood pressure  High blood pressure causes heart disease and increases the risk  of stroke. High blood pressure is more likely to develop in: ? People who have blood pressure in the high end of the normal range (130-139/85-89 mm Hg). ? People who are overweight or obese. ? People who are African American.  If you are 16-41 years of age, have your blood pressure checked every 3-5 years. If you are 72 years of age or older, have your blood pressure checked every year. You should have your blood pressure measured twice-once when you are at a hospital or clinic, and once when you are not at a hospital or clinic. Record the average of the two measurements. To check your blood pressure when you are not at a hospital or clinic, you can use: ? An automated blood pressure machine at a pharmacy. ? A home blood pressure monitor.  If you are between 28 years and 84 years old, ask your health care provider if you should take aspirin to prevent strokes.  Have regular diabetes screenings. This involves taking a blood sample to  check your fasting blood sugar level. ? If you are at a normal weight and have a low risk for diabetes, have this test once every three years after 51 years of age. ? If you are overweight and have a high risk for diabetes, consider being tested at a younger age or more often. Preventing infection Hepatitis B  If you have a higher risk for hepatitis B, you should be screened for this virus. You are considered at high risk for hepatitis B if: ? You were born in a country where hepatitis B is common. Ask your health care provider which countries are considered high risk. ? Your parents were born in a high-risk country, and you have not been immunized against hepatitis B (hepatitis B vaccine). ? You have HIV or AIDS. ? You use needles to inject street drugs. ? You live with someone who has hepatitis B. ? You have had sex with someone who has hepatitis B. ? You get hemodialysis treatment. ? You take certain medicines for conditions, including cancer, organ transplantation, and autoimmune conditions.  Hepatitis C  Blood testing is recommended for: ? Everyone born from [birth year] through 1964. ? Anyone with known risk factors for hepatitis C.  Sexually transmitted infections (STIs)  You should be screened for sexually transmitted infections (STIs) including gonorrhea and chlamydia if: ? You are sexually active and are younger than 51 years of age. ? You are older than 51 years of age and your health care provider tells you that you are at risk for this type of infection. ? Your sexual activity has changed since you were last screened and you are at an increased risk for chlamydia or gonorrhea. Ask your health care provider if you are at risk.  If you do not have HIV, but are at risk, it may be recommended that you take a prescription medicine daily to prevent HIV infection. This is called pre-exposure prophylaxis (PrEP). You are considered at risk if: ? You are sexually active and do not regularly  use condoms or know the HIV status of your partner(s). ? You take drugs by injection. ? You are sexually active with a partner who has HIV.  Talk with your health care provider about whether you are at high risk of being infected with HIV. If you choose to begin PrEP, you should first be tested for HIV. You should then be tested every 3 months for as long as you are taking PrEP. Pregnancy  If you are premenopausal and you may become pregnant, ask your health care provider about preconception counseling.  If you may become pregnant, take 400 to 800 micrograms (mcg) of folic acid every day.  If you want to prevent pregnancy, talk to your health care provider about birth control (contraception). Osteoporosis and menopause  Osteoporosis is a disease in which the bones lose minerals and strength with aging. This can result in serious bone fractures. Your risk for osteoporosis can be identified using a bone density scan.  If you are 36 years of age or older, or if you are at risk for osteoporosis and fractures, ask your health care provider if you should be screened.  Ask your health care provider whether you should take a calcium or vitamin D supplement to lower your risk for osteoporosis.  Menopause may have certain physical symptoms and risks.  Hormone replacement therapy may reduce some of these symptoms and risks. Talk to your health care provider about whether hormone replacement therapy is right for you. Follow these instructions at home:  Schedule regular health, dental, and eye exams.  Stay current with your immunizations.  Do not use any tobacco products including cigarettes, chewing tobacco, or electronic cigarettes.  If you are pregnant, do not drink alcohol.  If you are breastfeeding, limit how much and how often you drink alcohol.  Limit alcohol intake to no more than 1 drink per day for nonpregnant women. One drink equals 12 ounces of beer, 5 ounces of wine, or 1 ounces  of hard liquor.  Do not use street drugs.  Do not share needles.  Ask your health care provider for help if you need support or information about quitting drugs.  Tell your health care provider if you often feel depressed.  Tell your health care provider if you have ever been abused or do not feel safe at home. This information is not intended to replace advice given to you by your health care provider. Make sure you discuss any questions you have with your health care provider. Document Released: 11/29/2010 Document Revised: 10/22/2015 Document Reviewed: 02/17/2015 Elsevier Interactive Patient Education  [employer].

## 2017-12-12 NOTE — Progress Notes (Signed)
Patient ID: Kayla Ortiz, female  DOB: 09/29/1965, 52 y.o.   MRN: [id number] Patient Care Team    Relationship Specialty Notifications Start End  Natalia Leatherwood, DO PCP - General Family Medicine  07/29/16   Cottle, Steva Ready., MD Attending Physician Psychiatry  07/29/16    Comment: [facility]  Hatcher, Lacretia Leigh, MD Consulting Physician Infectious Diseases  07/29/16   Ob/Gyn, Nestor Ramp    07/29/16   Meryl Dare, MD Consulting Physician Gastroenterology  12/13/17     Chief Complaint  Patient presents with  . Annual Exam    Subjective:  Kayla Ortiz is a 52 y.o.  Female  present for CPE. All past medical history, surgical history, allergies, family history, immunizations, medications and social history were updated in the electronic medical record today. All recent labs, ED visits and hospitalizations within the last year were reviewed.  Reviewed labs collected by outside source abstracted.  Normal with the exception of very mildly elevated triglycerides.  Health maintenance:  Colonoscopy: no fhx. She will reschedule w/ gastro.  Mammogram: completed:07/22/2016. With GYN Cervical cancer screening: last pap: 07/22/2016 with GYN- [facility] Immunizations: tdap updated today, Influenza (encouraged yearly), shingrix #1 today Infectious disease screening: HIV completed DEXA: routine screen Assistive device: none Oxygen MV[facility]:QION Patient has a Dental home. Hospitalizations/ED visits: reviewed  Depression screen [facility] 2/9 12/12/2017 10/05/2017 07/29/2016  Decreased Interest 0 0 0  Down, Depressed, Hopeless 0 0 0  PHQ - 2 Score 0 0 0   No flowsheet data found.   Current Exercise Habits: Home exercise routine, Type of exercise: calisthenics(sports), Time (Minutes): 30, Frequency (Times/Week): 3, Weekly Exercise (Minutes/Week): 90, Intensity: Mild Exercise limited by: None identified   Immunization History  Administered Date(s) Administered  . Tdap 12/12/2017  .  Zoster Recombinat (Shingrix) 12/12/2017     Past Medical History:  Diagnosis Date  . Allergy   . Depression with anxiety   . Dyspepsia   . Hypercholesteremia   . Hyperkalemia 2017   Very mild, felt to be from ACEi, resolved after med switched.   . Hypertension   . MRSA (methicillin resistant staph aureus) culture positive    Nasal cavity  . OSA (obstructive sleep apnea) 11/15/2016  . Sleep apnea    WEARS CPAP- MINOR    Allergies  Allergen Reactions  . Sulfonamide Derivatives Nausea And Vomiting    REACTION: nausea and vommitting   Past Surgical History:  Procedure Laterality Date  . Cardaic ablasion  2005  . CERVICAL FUSION  2017   Family History  Problem Relation Age of Onset  . Hypertension Mother   . Heart failure Mother   . Arthritis Mother   . Stroke Father   . Diabetes Father   . Heart disease Father   . Arthritis Sister   . Colon cancer Neg Hx   . Colon polyps Neg Hx   . Esophageal cancer Neg Hx   . Rectal cancer Neg Hx   . Stomach cancer Neg Hx    Social History   Socioeconomic History  . Marital status: Married    Spouse name: Joe  . Number of children: 2  . Years of education: 64  . Highest education level: Not on file  Occupational History  . Occupation: Actor  . Occupation: Museum/gallery conservator  . Financial resource strain: Not on file  . Food insecurity:    Worry: Not on file    Inability: Not on  file  . Transportation needs:    Medical: Not on file    Non-medical: Not on file  Tobacco Use  . Smoking status: Never Smoker  . Smokeless tobacco: Never Used  Substance and Sexual Activity  . Alcohol use: No  . Drug use: No  . Sexual activity: Yes    Partners: Male    Birth control/protection: Post-menopausal    Comment: Married  Lifestyle  . Physical activity:    Days per week: Not on file    Minutes per session: Not on file  . Stress: Not on file  Relationships  . Social connections:    Talks on phone: Not on  file    Gets together: Not on file    Attends religious service: Not on file    Active member of club or organization: Not on file    Attends meetings of clubs or organizations: Not on file    Relationship status: Not on file  . Intimate partner violence:    Fear of current or ex partner: Not on file    Emotionally abused: Not on file    Physically abused: Not on file    Forced sexual activity: Not on file  Other Topics Concern  . Not on file  Social History Narrative   Married to [place]. 2 children Jomarie Longs and [place].   Bachelors degree. Part-time Marine scientist.   Drinks caffeine. Takes a daily vitamin.   Wears her seatbelt. Wears a bicycle helmet.   Exercises routinely.   Smoke detector in the home.   Feels safe in her relationship.   Allergies as of 12/12/2017      Reactions   Sulfonamide Derivatives Nausea And Vomiting   REACTION: nausea and vommitting      Medication List        Accurate as of 12/12/17 11:59 PM. Always use your most recent med list.          aspirin EC 81 MG tablet Take 81 mg by mouth daily.   BRINTELLIX 20 MG Tabs tablet Generic drug:  vortioxetine HBr Take 20 mg by mouth daily.   buPROPion 300 MG 24 hr tablet Commonly known as:  WELLBUTRIN XL   CALCIUM PO Take 1 tablet by mouth daily.   cetirizine 10 MG tablet Commonly known as:  ZYRTEC TAKE 1 TABLET BY MOUTH EVERY DAY   Dexmethylphenidate HCl 40 MG Cp24 TK 2 CS PO AND 1 C QPM   doxycycline 100 MG capsule Commonly known as:  MONODOX Take 1 capsule (100 mg total) by mouth daily as needed.   FISH OIL CONCENTRATE PO Take 1,400 mg by mouth.   fluticasone 50 MCG/ACT nasal spray Commonly known as:  FLONASE Place 2 sprays into both nostrils daily.   hydrochlorothiazide 12.5 MG tablet Commonly known as:  HYDRODIURIL Take 1 tablet (12.5 mg total) by mouth daily.   lamoTRIgine 200 MG tablet Commonly known as:  LAMICTAL Take 200 mg by mouth daily.   magnesium 30 MG tablet Take  30 mg by mouth 2 (two) times daily.   multivitamin with minerals Tabs tablet Take 1 tablet by mouth daily.   traZODone 50 MG tablet Commonly known as:  DESYREL Take 1-2 tablets by mouth at bedtime as needed.   Vitamin D 2000 units Caps Take 1 capsule by mouth daily.       All past medical history, surgical history, allergies, family history, immunizations andmedications were updated in the EMR today and reviewed under the history and medication  portions of their EMR.     Recent Results (from the past 2160 hour(s))  CBC     Status: None   Collection Time: 12/12/17  3:41 PM  Result Value Ref Range   WBC 4.4 3.8 - 10.8 Thousand/uL   RBC 4.58 3.80 - 5.10 Million/uL   Hemoglobin 13.4 11.7 - 15.5 g/dL   HCT 60.4 54.0 - 98.1 %   MCV 86.7 80.0 - 100.0 fL   MCH 29.3 27.0 - 33.0 pg   MCHC 33.8 32.0 - 36.0 g/dL   RDW 19.1 47.8 - 29.5 %   Platelets 251 140 - 400 Thousand/uL   MPV 12.5 7.5 - 12.5 fL  TSH     Status: None   Collection Time: 12/12/17  3:41 PM  Result Value Ref Range   TSH 1.78 mIU/L    Comment:           Reference Range .           > or = 20 Years  0.40-4.50 .                Pregnancy Ranges           First trimester    0.26-2.66           Second trimester   0.55-2.73           Third trimester    0.43-2.91     Dg Chest 2 View  Result Date: 04/25/2017 CLINICAL DATA:  Central chest pain. EXAM: CHEST  2 VIEW COMPARISON:  None. FINDINGS: The heart size and mediastinal contours are within normal limits. Both lungs are clear. The visualized skeletal structures are unremarkable. IMPRESSION: No active cardiopulmonary disease. Electronically Signed   By: Elige Ko   On: 04/25/2017 08:19     ROS: 14 pt review of systems performed and negative (unless mentioned in an HPI)  Objective: BP 109/73 (BP Location: Left Arm, Patient Position: Sitting, Cuff Size: Normal)   Pulse 75   Temp (!) 97.5 F (36.4 C)   Resp 20   Ht 6' (1.829 m)   Wt 173 lb 4 oz (78.6 kg)   LMP  04/15/2011   SpO2 98%   BMI 23.50 kg/m  Gen: Afebrile. No acute distress. Nontoxic in appearance, well-developed, well-nourished, very pleasant Caucasian female. HENT: AT. [place]. Bilateral TM visualized and normal in appearance, normal external auditory canal. MMM, no oral lesions, adequate dentition. Bilateral nares within normal limits. Throat without erythema, ulcerations or exudates.  No cough on exam, no hoarseness on exam. Eyes:Pupils Equal Round Reactive to light, Extraocular movements intact,  Conjunctiva without redness, discharge or icterus. Neck/lymp/endocrine: Supple, no lymphadenopathy, no thyromegaly CV: RRR no murmur, no edema, +2/4 P posterior tibialis pulses.  No carotid bruits. No JVD. Chest: CTAB, no wheeze, rhonchi or crackles.  Normal respiratory effort.  Good air movement. Abd: Soft.  Flat. NTND. BS present.  No masses palpated. No hepatosplenomegaly. No rebound tenderness or guarding. Skin: No rashes, purpura or petechiae. Warm and well-perfused. Skin intact. Neuro/Msk:  Normal gait. PERLA. EOMi. Alert. Oriented x3.  Cranial nerves II through XII intact. Muscle strength 5/5 upper/lower extremity. DTRs equal bilaterally. Psych: Normal affect, dress and demeanor. Normal speech. Normal thought content and judgment.    Visual Acuity Screening   Right eye Left eye Both eyes  Without correction:     With correction: 20/20 20/20 20/20     Assessment/plan: Kayla Ortiz is a 52 y.o. female present for CPE. Hypercholesteremia/  Essential hypertension - stable BP, refills provided today.  - Start fish oil supplement for mildly elevated tg. She exercises routinely and diets.  - CBC - TSH Immunization due - Tdap vaccine greater than or equal to 7yo IM - Varicella-zoster vaccine IM #1 Seasonal allergic rhinitis due to pollen - fluticasone (FLONASE) 50 MCG/ACT nasal spray; Place 2 sprays into both nostrils daily.  Dispense: 16 g; Refill: 6 Encounter for preventive health  examination Patient was encouraged to exercise greater than 150 minutes a week. Patient was encouraged to choose a diet filled with fresh fruits and vegetables, and lean meats. AVS provided to patient today for education/recommendation on gender specific health and safety maintenance. Colonoscopy: no fhx. She will reschedule w/ gastro.  Mammogram: completed:07/22/2016. With GYN Cervical cancer screening: last pap: 07/22/2016 with GYN- [facility] Immunizations: tdap updated today, Influenza (encouraged yearly), shingrix #1 today Infectious disease screening: HIV completed Return in about 1 year (around 12/13/2018) for CPE.  Electronically signed by: Felix Pacinienee Taurus Alamo, DO [facility]

## 2017-12-13 ENCOUNTER — Encounter: Payer: Self-pay | Admitting: Family Medicine

## 2017-12-13 LAB — CBC
HCT: 39.7 % (ref 35.0–45.0)
Hemoglobin: 13.4 g/dL (ref 11.7–15.5)
MCH: 29.3 pg (ref 27.0–33.0)
MCHC: 33.8 g/dL (ref 32.0–36.0)
MCV: 86.7 fL (ref 80.0–100.0)
MPV: 12.5 fL (ref 7.5–12.5)
Platelets: 251 10*3/uL (ref 140–400)
RBC: 4.58 10*6/uL (ref 3.80–5.10)
RDW: 12.3 % (ref 11.0–15.0)
WBC: 4.4 10*3/uL (ref 3.8–10.8)

## 2017-12-13 LAB — TSH: TSH: 1.78 mIU/L

## 2017-12-20 ENCOUNTER — Encounter: Payer: Self-pay | Admitting: Gastroenterology

## 2018-01-17 ENCOUNTER — Ambulatory Visit (AMBULATORY_SURGERY_CENTER): Payer: Self-pay | Admitting: *Deleted

## 2018-01-17 VITALS — Ht 72.0 in | Wt 168.0 lb

## 2018-01-17 DIAGNOSIS — Z1211 Encounter for screening for malignant neoplasm of colon: Secondary | ICD-10-CM

## 2018-01-17 MED ORDER — NA SULFATE-K SULFATE-MG SULF 17.5-3.13-1.6 GM/177ML PO SOLN: 1.0000 | Freq: Once | ORAL | 0 refills | Status: AC

## 2018-01-17 NOTE — Progress Notes (Signed)
Patient denies any allergies to eggs or soy. Patient denies any problems with anesthesia/sedation. Patient denies any oxygen use at home. Patient denies taking any diet/weight loss medications or blood thinners. EMMI education offered, pt declined. suprep $15 coupon given to pt.  

## 2018-01-18 ENCOUNTER — Encounter: Payer: Self-pay | Admitting: Gastroenterology

## 2018-01-25 ENCOUNTER — Encounter: Payer: Self-pay | Admitting: Gastroenterology

## 2018-03-05 ENCOUNTER — Encounter: Payer: Self-pay | Admitting: Gastroenterology

## 2018-03-12 ENCOUNTER — Other Ambulatory Visit: Payer: Self-pay | Admitting: Family Medicine

## 2018-03-14 ENCOUNTER — Ambulatory Visit: Payer: BLUE CROSS/BLUE SHIELD

## 2018-03-19 ENCOUNTER — Telehealth: Payer: Self-pay | Admitting: Gastroenterology

## 2018-03-19 NOTE — Telephone Encounter (Signed)
Ok , please charge late cancellation fee

## 2018-03-19 NOTE — Telephone Encounter (Signed)
Hi Dr. Lavon Paganini, this pt just cancelled her procedure scheduled on 03/21/18 with you. She stated that she had to go to [place] to see her dad who is not doing well. She has r/s two times before due to conflict with her job. She will call back to r/s. Thank you.

## 2018-03-21 ENCOUNTER — Encounter: Payer: Self-pay | Admitting: Gastroenterology

## 2018-04-06 ENCOUNTER — Ambulatory Visit: Payer: BLUE CROSS/BLUE SHIELD | Admitting: Family Medicine

## 2018-04-06 DIAGNOSIS — Z0289 Encounter for other administrative examinations: Secondary | ICD-10-CM

## 2018-04-20 ENCOUNTER — Other Ambulatory Visit: Payer: Self-pay

## 2018-04-20 MED ORDER — BRINTELLIX 20 MG PO TABS: 20.0000 mg | ORAL_TABLET | Freq: Every day | ORAL | 2 refills | Status: AC

## 2018-06-04 ENCOUNTER — Other Ambulatory Visit: Payer: Self-pay | Admitting: Emergency Medicine

## 2018-06-04 DIAGNOSIS — I10 Essential (primary) hypertension: Secondary | ICD-10-CM

## 2018-06-04 MED ORDER — HYDROCHLOROTHIAZIDE 12.5 MG PO TABS: 12.5000 mg | ORAL_TABLET | Freq: Every day | ORAL | 1 refills | Status: AC

## 2018-11-03 ENCOUNTER — Other Ambulatory Visit: Payer: Self-pay | Admitting: Family Medicine

## 2018-11-03 DIAGNOSIS — Z20822 Contact with and (suspected) exposure to covid-19: Secondary | ICD-10-CM

## 2018-11-05 LAB — NOVEL CORONAVIRUS, NAA: SARS-CoV-2, NAA: NOT DETECTED

## 2019-05-07 ENCOUNTER — Other Ambulatory Visit: Payer: Self-pay

## 2019-05-07 DIAGNOSIS — Z20822 Contact with and (suspected) exposure to covid-19: Secondary | ICD-10-CM

## 2019-05-09 LAB — NOVEL CORONAVIRUS, NAA: SARS-CoV-2, NAA: NOT DETECTED

## 2019-08-03 ENCOUNTER — Ambulatory Visit: Payer: BLUE CROSS/BLUE SHIELD | Attending: Internal Medicine

## 2019-08-03 DIAGNOSIS — Z23 Encounter for immunization: Secondary | ICD-10-CM | POA: Insufficient documentation

## 2019-08-03 NOTE — Progress Notes (Signed)
   Covid-19 Vaccination Clinic  Name:  TRESSY KUNZMAN    MRN: [id number] DOB: March 03, 1966  08/03/2019  Ms. Fontanilla was observed post Covid-19 immunization for 15 minutes without incident. She was provided with Vaccine Information Sheet and instruction to access the V-Safe system.   Ms. Kwiecinski was instructed to call 911 with any severe reactions post vaccine: Marland Kitchen Difficulty breathing  . Swelling of face and throat  . A fast heartbeat  . A bad rash all over body  . Dizziness and weakness   Immunizations Administered    Name Date Dose VIS Date Route   Pfizer COVID-19 Vaccine 08/03/2019  8:22 AM 0.3 mL 05/10/2019 Intramuscular   Manufacturer: [employer], [employer]   Lot: [id number]   NDC: [address]-1

## 2019-08-24 ENCOUNTER — Ambulatory Visit: Payer: BLUE CROSS/BLUE SHIELD | Attending: Internal Medicine

## 2019-08-24 DIAGNOSIS — Z23 Encounter for immunization: Secondary | ICD-10-CM

## 2019-08-24 NOTE — Progress Notes (Signed)
   Covid-19 Vaccination Clinic  Name:  Kayla Ortiz    MRN: [id number] DOB: 1966/01/16  08/24/2019  Kayla Ortiz was observed post Covid-19 immunization for 15 minutes without incident. She was provided with Vaccine Information Sheet and instruction to access the V-Safe system.   Kayla Ortiz was instructed to call 911 with any severe reactions post vaccine: Marland Kitchen Difficulty breathing  . Swelling of face and throat  . A fast heartbeat  . A bad rash all over body  . Dizziness and weakness   Immunizations Administered    Name Date Dose VIS Date Route   Pfizer COVID-19 Vaccine 08/24/2019  8:38 AM 0.3 mL 05/10/2019 Intramuscular   Manufacturer: [employer], [employer]   Lot: [id number]   NDC: [address]-3

## 2020-04-07 ENCOUNTER — Ambulatory Visit
Admission: AD | Admit: 2020-04-07 | Discharge: 2020-04-07 | Disposition: A | Discharge status: Home / Self Care | Payer: BLUE CROSS/BLUE SHIELD | Source: Ambulatory Visit

## 2020-04-07 ENCOUNTER — Encounter: Payer: Self-pay | Admitting: Emergency Medicine

## 2020-04-07 DIAGNOSIS — R519 Headache, unspecified: Secondary | ICD-10-CM

## 2020-04-07 DIAGNOSIS — R059 Cough, unspecified: Secondary | ICD-10-CM

## 2020-04-07 DIAGNOSIS — R0989 Other specified symptoms and signs involving the circulatory and respiratory systems: Secondary | ICD-10-CM

## 2020-04-07 DIAGNOSIS — U099 Post covid-19 condition, unspecified: Secondary | ICD-10-CM

## 2020-04-07 DIAGNOSIS — U071 COVID-19: Secondary | ICD-10-CM

## 2020-04-07 MED ORDER — BENZONATATE 100 MG PO CAPS *I*
200.0000 mg | ORAL_CAPSULE | Freq: Three times a day (TID) | ORAL | 0 refills | Status: AC | PRN
Start: 2020-04-07 — End: 2020-04-10

## 2020-04-07 NOTE — Discharge Instructions (Addendum)
You were seen at urgent care today for upper respiratory symptoms, and were prescribed a medication for your cough.    You are to quarantine until 04/15/20.    Please use a sinus rinse or saline nasal spray to flush your sinuses. You may follow this with an inhaled steroid nasal spray such as Flonase, as this will help reduce mucosal inflammation and swelling.    Rest and drink plenty of fluids.    Please begin to take an over-the-counter antihistamine such as Claritin or Allegra daily for the next 1-2 weeks to help reduce sinus congestion and swelling.    Take Motrin or tylenol every 6 hours as needed for pain.  Take Mucinex or Delsym OTC as needed for cough during the day  Wash hands frequently to prevent the spread of infection.  Warm salt water gargles and hot tea for a sore throat.  Use Throat lozenges as needed for your sore throat.     Guaifenesin (expectorant) thins mucus and helps it move.    Follow up with your PCP if symptoms worsen.  Please return to urgent care or the emergency department if you develop new or worsening fever/chills, wheezing, shortness of breath, chest pain, abdominal pain, persistent nausea/vomiting or inability to tolerate fluids by mouth.

## 2020-04-07 NOTE — UC Provider Note (Signed)
History     Chief Complaint   Patient presents with    Cough     Pt. reports to UC with c/o cough; Pt. states she was tested COVID + 04/06/20; Denies using any OTC medications for cough;      Patient is a 54 year old female with no significant past medical history who presents with cough, headache and congestion for the past 3 days.  Patient reports that she had a positive Covid test yesterday.  She states that she has not been taking any medications over-the-counter for her symptoms, however reports today significant cough.  She denies fever/chills, chest pains of palpitations, loss of sense of taste or smell, sore throat, wheezing, chest tightness, shortness of breath, abdominal pain or nausea/vomiting.  She denies any history of chronic lung disorders.  She reports that she is eating and drinking without difficulty.          Medical/Surgical/Family History     No past medical history on file.     There is no problem list on file for this patient.           No past surgical history on file.  No family history on file.       Social History     Tobacco Use    Smoking status: Not on file    Smokeless tobacco: Not on file   Substance Use Topics    Alcohol use: Not on file    Drug use: Not on file     Living Situation     Questions Responses    Patient lives with     Homeless     Caregiver for other family member     External Services     Employment     Domestic Violence Risk                 Review of Systems   Review of Systems   Constitutional: Negative for appetite change, chills, diaphoresis and fever.   HENT: Positive for congestion. Negative for dental problem, ear discharge, ear pain, postnasal drip, rhinorrhea, sinus pressure, sinus pain, sore throat and trouble swallowing.    Eyes: Negative.    Respiratory: Positive for cough (dry). Negative for chest tightness, shortness of breath and wheezing.    Cardiovascular: Negative.  Negative for chest pain.   Gastrointestinal: Negative for abdominal pain,  diarrhea, nausea and vomiting.   Endocrine: Negative.    Genitourinary: Negative.  Negative for dysuria, flank pain, frequency and urgency.   Musculoskeletal: Negative for arthralgias and myalgias.   Allergic/Immunologic: Negative.    Neurological: Positive for headaches. Negative for dizziness and light-headedness.   Hematological: Negative.    Psychiatric/Behavioral: Negative.        Physical Exam   Triage Vitals  Triage Start: Start, (04/07/20 1753)   First Recorded BP: 140/74, Resp: 18, Temp: 36.3 C (97.3 F), Temp src: TEMPORAL Oxygen Therapy SpO2: 100 %, Oximetry Source: Lt Hand, O2 Device: None (Room air), Heart Rate: 74, (04/07/20 1800)  .      Physical Exam  Vitals and nursing note reviewed.   Constitutional:       General: She is not in acute distress.     Appearance: She is well-developed. She is not ill-appearing, toxic-appearing or diaphoretic.   HENT:      Head:      Jaw: No trismus.      Right Ear: Tympanic membrane is injected. Tympanic membrane is not erythematous or bulging.  Left Ear: Tympanic membrane is injected. Tympanic membrane is not erythematous or bulging.      Nose: Mucosal edema present. No rhinorrhea.      Right Sinus: No maxillary sinus tenderness or frontal sinus tenderness.      Left Sinus: No maxillary sinus tenderness or frontal sinus tenderness.      Mouth/Throat:      Mouth: Mucous membranes are moist.      Pharynx: Uvula midline. Posterior oropharyngeal erythema present. No oropharyngeal exudate.      Tonsils: No tonsillar exudate. 1+ on the right. 1+ on the left.   Eyes:      Conjunctiva/sclera: Conjunctivae normal.   Cardiovascular:      Rate and Rhythm: Normal rate and regular rhythm.      Pulses:           Radial pulses are 2+ on the right side and 2+ on the left side.      Heart sounds: Normal heart sounds, S1 normal and S2 normal. No murmur heard.      Pulmonary:      Effort: Pulmonary effort is normal. No respiratory distress.      Breath sounds: Normal breath  sounds. No decreased breath sounds, wheezing, rhonchi or rales.   Abdominal:      General: Bowel sounds are normal. There is no distension.      Palpations: Abdomen is soft. Abdomen is not rigid.      Tenderness: There is no abdominal tenderness. There is no guarding. Negative signs include Murphy's sign and McBurney's sign.   Musculoskeletal:         General: Normal range of motion.      Cervical back: Normal range of motion.   Lymphadenopathy:      Cervical: No cervical adenopathy.   Skin:     General: Skin is warm and dry.      Capillary Refill: Capillary refill takes less than 2 seconds.   Neurological:      Mental Status: She is alert and oriented to person, place, and time.   Psychiatric:         Behavior: Behavior normal. Behavior is cooperative.         Thought Content: Thought content normal.          Medical Decision Making        Medical Decision Making  Assessment:    54 year old female presents with dry cough x2 days following recent COVID diagnosis. Vital signs reviewed and were stable. Patient appears alert and oriented and non-toxic. Lung sounds CTA bilaterally, and pt is in no respiratory distress. Will advise supportive measures and send tessalon for cough.    Differential diagnosis:    COVID-19  Viral URI  COVID-19  Viral bronchitis  Influenza  Nasopharyngitis  Strep Pharyngitis less likely as patient meets 0/4 Centor criteria  Nasosinusitis   Postnasal Drip  CAP less likely as patient afebrile with lung sounds clear to auscultation bilaterally          Plan and Results:     Encounter orders    Orders Placed This Encounter      benzonatate (TESSALON) 100 mg capsule        Diagnosis and Disposition:       Return precautions discussed and provided on AVS.    Discharge Medications  Current Discharge Medication List    New Medications    benzonatate (TESSALON) 200 mg  Dose: 200 mg  Take 200 mg by mouth  3 times daily as needed for Cough  Take 2 capsules by mouth 3 times daily as needed for  Cough.  Quantity 18 capsule Refill 0  Start date: 04/07/2020, End date: 04/10/2020        Follow-up   Follow-up Information     [facility] [place]. Go in 3 days.    Specialty: Emergency Medicine  Why: If symptoms worsen  Contact information:  [address]  [place] [address]  [phone]                   Patient Instructions   .       You were seen at urgent care today for upper respiratory symptoms, and   were prescribed a medication for your cough.    You are to quarantine until 04/15/20.    Please use a sinus rinse or saline nasal spray to flush your sinuses. You   may follow this with an inhaled steroid nasal spray such as Flonase, as   this will help reduce mucosal inflammation and swelling.    Rest and drink plenty of fluids.    Please begin to take an over-the-counter antihistamine such as Claritin or   Allegra daily for the next 1-2 weeks to help reduce sinus congestion and   swelling.    Take Motrin or tylenol every 6 hours as needed for pain.  Take Mucinex or Delsym OTC as needed for cough during the day  Wash hands frequently to prevent the spread of infection.  Warm salt water gargles and hot tea for a sore throat.  Use Throat lozenges as needed for your sore throat.     Guaifenesin (expectorant) thins mucus and helps it move.    Follow up with your PCP if symptoms worsen.  Please return to urgent care   or the emergency department if you develop new or worsening fever/chills,   wheezing, shortness of breath, chest pain, abdominal pain, persistent   nausea/vomiting or inability to tolerate fluids by mouth.      Final Diagnosis  Final diagnoses:   [U07.1] COVID-19 (Primary)         Wille Celeste, NP              Wille Celeste, NP  04/07/20 1814

## 2020-04-07 NOTE — ED Triage Notes (Signed)
Pt. reports to UC with c/o cough; Pt. states she was tested COVID + 04/06/20; Denies using any OTC medications for cough;     Current state of emergency related to COVID-19. Documentation completed per hospital emergency response standard.    Gary Fleet, RN

## 2020-04-11 ENCOUNTER — Ambulatory Visit: Payer: BLUE CROSS/BLUE SHIELD

## 2023-07-13 ENCOUNTER — Other Ambulatory Visit: Payer: Self-pay

## 2023-07-13 ENCOUNTER — Encounter (HOSPITAL_COMMUNITY): Payer: Self-pay

## 2023-07-13 ENCOUNTER — Emergency Department (HOSPITAL_COMMUNITY)
Admission: EM | Admit: 2023-07-13 | Discharge: 2023-07-13 | Discharge status: Against Medical Advice | Payer: BLUE CROSS/BLUE SHIELD | Attending: Emergency Medicine | Admitting: Emergency Medicine

## 2023-07-13 DIAGNOSIS — I10 Essential (primary) hypertension: Secondary | ICD-10-CM | POA: Diagnosis not present

## 2023-07-13 DIAGNOSIS — Z5321 Procedure and treatment not carried out due to patient leaving prior to being seen by health care provider: Secondary | ICD-10-CM | POA: Insufficient documentation

## 2023-07-13 DIAGNOSIS — R519 Headache, unspecified: Secondary | ICD-10-CM | POA: Diagnosis present

## 2023-07-13 LAB — CBC WITH DIFFERENTIAL/PLATELET
Abs Immature Granulocytes: 0.02 10*3/uL (ref 0.00–0.07)
Basophils Absolute: 0 10*3/uL (ref 0.0–0.1)
Basophils Relative: 1 %
Eosinophils Absolute: 0.2 10*3/uL (ref 0.0–0.5)
Eosinophils Relative: 3 %
HCT: 43.3 % (ref 36.0–46.0)
Hemoglobin: 14.4 g/dL (ref 12.0–15.0)
Immature Granulocytes: 0 %
Lymphocytes Relative: 20 %
Lymphs Abs: 1.4 10*3/uL (ref 0.7–4.0)
MCH: 28.5 pg (ref 26.0–34.0)
MCHC: 33.3 g/dL (ref 30.0–36.0)
MCV: 85.7 fL (ref 80.0–100.0)
Monocytes Absolute: 0.5 10*3/uL (ref 0.1–1.0)
Monocytes Relative: 7 %
Neutro Abs: 4.9 10*3/uL (ref 1.7–7.7)
Neutrophils Relative %: 69 %
Platelets: 258 10*3/uL (ref 150–400)
RBC: 5.05 MIL/uL (ref 3.87–5.11)
RDW: 13.2 % (ref 11.5–15.5)
WBC: 7.1 10*3/uL (ref 4.0–10.5)
nRBC: 0 % (ref 0.0–0.2)

## 2023-07-13 LAB — COMPREHENSIVE METABOLIC PANEL
ALT: 18 U/L (ref 0–44)
AST: 22 U/L (ref 15–41)
Albumin: 4 g/dL (ref 3.5–5.0)
Alkaline Phosphatase: 64 U/L (ref 38–126)
Anion gap: 12 (ref 5–15)
BUN: 12 mg/dL (ref 6–20)
CO2: 27 mmol/L (ref 22–32)
Calcium: 9.5 mg/dL (ref 8.9–10.3)
Chloride: 101 mmol/L (ref 98–111)
Creatinine, Ser: 0.83 mg/dL (ref 0.44–1.00)
GFR, Estimated: >60 mL/min (ref >60)
Glucose, Bld: 91 mg/dL (ref 70–99)
Potassium: 3.8 mmol/L (ref 3.5–5.1)
Sodium: 140 mmol/L (ref 135–145)
Total Bilirubin: 0.6 mg/dL (ref 0.0–1.2)
Total Protein: 7 g/dL (ref 6.5–8.1)

## 2023-07-13 LAB — URINALYSIS, ROUTINE W REFLEX MICROSCOPIC
Bilirubin Urine: NEGATIVE
Glucose, UA: NEGATIVE mg/dL
Hgb urine dipstick: NEGATIVE
Ketones, ur: NEGATIVE mg/dL
Nitrite: NEGATIVE
Protein, ur: NEGATIVE mg/dL
Specific Gravity, Urine: 1.014 (ref 1.005–1.030)
pH: 7 (ref 5.0–8.0)

## 2023-07-13 LAB — TROPONIN I (HIGH SENSITIVITY): Troponin I (High Sensitivity): 5 ng/L (ref <18)

## 2023-07-13 NOTE — ED Triage Notes (Signed)
Patient reports 2 days ago had some dizziness and her lips turned blue. 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  Reporting mild headache and hypertension.  Patient lips are back to normal color now.  Denies Chest pain or sob. Denies unilateral weakness.

## 2023-07-13 NOTE — ED Provider Triage Note (Signed)
Emergency Medicine Provider Triage Evaluation Note  Kayla Ortiz , a 58 y.o. female  was evaluated in triage.  Pt complains of her lips turning blue today.  Also reports her blood pressure was elevated today and her doctor told her to come to the ER.  Reports mild headache.  Denies any abdominal pain, dizziness, or changes in vision.  Denies any chest pain or shortness of breath.  Denies any weakness, facial droop.  Review of Systems  Positive: As above Negative: As above  Physical Exam  BP (!) 161/110 (BP Location: Right Arm)   Pulse 88   Temp 98.6 F (37 C)   Resp 16   Ht 6' (1.829 m)   Wt 76.2 kg   LMP 04/15/2011   SpO2 99%   BMI 22.78 kg/m  Gen:   Awake, no distress   Resp:  Normal effort  MSK:   Moves extremities without difficulty   Lips look normal color currently.  No unilateral weakness  Medical Decision Making  Medically screening exam initiated at 5:36 PM.  Appropriate orders placed.  Kayla Ortiz was informed that the remainder of the evaluation will be completed by another provider, this initial triage assessment does not replace that evaluation, and the importance of remaining in the ED until their evaluation is complete.     Arabella Merles, PA-C 07/13/23 1737

## 8387-01-29 DEATH — deceased
